# Patient Record
Sex: Female | Born: 1955
Health system: Southern US, Community
[De-identification: ages and names within clinical notes are randomized; demographics above are authoritative.]

## PROBLEM LIST (undated history)

## (undated) DIAGNOSIS — E785 Hyperlipidemia, unspecified: Secondary | ICD-10-CM

## (undated) DIAGNOSIS — E559 Vitamin D deficiency, unspecified: Secondary | ICD-10-CM

## (undated) DIAGNOSIS — E039 Hypothyroidism, unspecified: Secondary | ICD-10-CM

## (undated) HISTORY — PX: SPINE SURGERY: SHX786

## (undated) HISTORY — DX: Vitamin D deficiency, unspecified: E55.9

## (undated) HISTORY — DX: Hypothyroidism, unspecified: E03.9

## (undated) HISTORY — PX: ABDOMINAL HYSTERECTOMY: SHX81

## (undated) HISTORY — DX: Hyperlipidemia, unspecified: E78.5

---

## 2015-09-01 DIAGNOSIS — E039 Hypothyroidism, unspecified: Secondary | ICD-10-CM | POA: Diagnosis not present

## 2015-09-01 DIAGNOSIS — E7800 Pure hypercholesterolemia, unspecified: Secondary | ICD-10-CM | POA: Diagnosis not present

## 2015-09-01 DIAGNOSIS — H539 Unspecified visual disturbance: Secondary | ICD-10-CM | POA: Insufficient documentation

## 2015-09-01 DIAGNOSIS — Z6829 Body mass index (BMI) 29.0-29.9, adult: Secondary | ICD-10-CM | POA: Diagnosis not present

## 2015-09-21 DIAGNOSIS — H2513 Age-related nuclear cataract, bilateral: Secondary | ICD-10-CM | POA: Diagnosis not present

## 2015-09-21 DIAGNOSIS — D3131 Benign neoplasm of right choroid: Secondary | ICD-10-CM | POA: Diagnosis not present

## 2015-09-21 DIAGNOSIS — G43819 Other migraine, intractable, without status migrainosus: Secondary | ICD-10-CM | POA: Diagnosis not present

## 2016-04-10 DIAGNOSIS — Z0000 Encounter for general adult medical examination without abnormal findings: Secondary | ICD-10-CM | POA: Diagnosis not present

## 2016-04-10 DIAGNOSIS — Z1231 Encounter for screening mammogram for malignant neoplasm of breast: Secondary | ICD-10-CM | POA: Diagnosis not present

## 2016-04-10 DIAGNOSIS — E039 Hypothyroidism, unspecified: Secondary | ICD-10-CM | POA: Diagnosis not present

## 2016-04-10 DIAGNOSIS — E7800 Pure hypercholesterolemia, unspecified: Secondary | ICD-10-CM | POA: Diagnosis not present

## 2016-04-10 DIAGNOSIS — Z1211 Encounter for screening for malignant neoplasm of colon: Secondary | ICD-10-CM | POA: Diagnosis not present

## 2016-04-18 DIAGNOSIS — Z1231 Encounter for screening mammogram for malignant neoplasm of breast: Secondary | ICD-10-CM | POA: Diagnosis not present

## 2016-05-01 DIAGNOSIS — N951 Menopausal and female climacteric states: Secondary | ICD-10-CM | POA: Diagnosis not present

## 2016-05-01 DIAGNOSIS — Z01419 Encounter for gynecological examination (general) (routine) without abnormal findings: Secondary | ICD-10-CM | POA: Diagnosis not present

## 2016-05-01 DIAGNOSIS — Z0000 Encounter for general adult medical examination without abnormal findings: Secondary | ICD-10-CM | POA: Diagnosis not present

## 2016-05-01 DIAGNOSIS — Z6829 Body mass index (BMI) 29.0-29.9, adult: Secondary | ICD-10-CM | POA: Diagnosis not present

## 2016-05-24 DIAGNOSIS — Z1211 Encounter for screening for malignant neoplasm of colon: Secondary | ICD-10-CM | POA: Diagnosis not present

## 2016-05-24 DIAGNOSIS — K649 Unspecified hemorrhoids: Secondary | ICD-10-CM | POA: Diagnosis not present

## 2016-10-15 DIAGNOSIS — E038 Other specified hypothyroidism: Secondary | ICD-10-CM | POA: Diagnosis not present

## 2016-10-15 DIAGNOSIS — E7800 Pure hypercholesterolemia, unspecified: Secondary | ICD-10-CM | POA: Diagnosis not present

## 2016-10-16 DIAGNOSIS — Z6829 Body mass index (BMI) 29.0-29.9, adult: Secondary | ICD-10-CM | POA: Diagnosis not present

## 2016-10-16 DIAGNOSIS — E039 Hypothyroidism, unspecified: Secondary | ICD-10-CM | POA: Diagnosis not present

## 2016-10-16 DIAGNOSIS — E7800 Pure hypercholesterolemia, unspecified: Secondary | ICD-10-CM | POA: Diagnosis not present

## 2017-05-13 ENCOUNTER — Encounter: Payer: Self-pay | Admitting: Family Medicine

## 2017-05-13 ENCOUNTER — Ambulatory Visit (INDEPENDENT_AMBULATORY_CARE_PROVIDER_SITE_OTHER): Payer: BLUE CROSS/BLUE SHIELD | Admitting: Family Medicine

## 2017-05-13 VITALS — BP 110/80 | HR 63 | Ht 64.5 in | Wt 175.6 lb

## 2017-05-13 DIAGNOSIS — Z780 Asymptomatic menopausal state: Secondary | ICD-10-CM | POA: Diagnosis not present

## 2017-05-13 DIAGNOSIS — E7800 Pure hypercholesterolemia, unspecified: Secondary | ICD-10-CM | POA: Diagnosis not present

## 2017-05-13 DIAGNOSIS — Z1239 Encounter for other screening for malignant neoplasm of breast: Secondary | ICD-10-CM

## 2017-05-13 DIAGNOSIS — Z1231 Encounter for screening mammogram for malignant neoplasm of breast: Secondary | ICD-10-CM | POA: Diagnosis not present

## 2017-05-13 DIAGNOSIS — E559 Vitamin D deficiency, unspecified: Secondary | ICD-10-CM

## 2017-05-13 DIAGNOSIS — E039 Hypothyroidism, unspecified: Secondary | ICD-10-CM | POA: Diagnosis not present

## 2017-05-13 LAB — COMPREHENSIVE METABOLIC PANEL
ALBUMIN: 4.6 g/dL (ref 3.5–5.2)
ALK PHOS: 75 U/L (ref 39–117)
ALT: 18 U/L (ref 0–35)
AST: 19 U/L (ref 0–37)
BILIRUBIN TOTAL: 0.7 mg/dL (ref 0.2–1.2)
BUN: 14 mg/dL (ref 6–23)
CALCIUM: 10.2 mg/dL (ref 8.4–10.5)
CO2: 31 meq/L (ref 19–32)
CREATININE: 0.86 mg/dL (ref 0.40–1.20)
Chloride: 107 mEq/L (ref 96–112)
GFR: 70.98 mL/min (ref >60.00)
Glucose, Bld: 104 mg/dL — ABNORMAL HIGH (ref 70–99)
Potassium: 4.9 mEq/L (ref 3.5–5.1)
Sodium: 145 mEq/L (ref 135–145)
TOTAL PROTEIN: 7 g/dL (ref 6.0–8.3)

## 2017-05-13 LAB — URINALYSIS, ROUTINE W REFLEX MICROSCOPIC
BILIRUBIN URINE: NEGATIVE
Hgb urine dipstick: NEGATIVE
KETONES UR: NEGATIVE
LEUKOCYTES UA: NEGATIVE
Nitrite: NEGATIVE
PH: 6.5 (ref 5.0–8.0)
RBC / HPF: NONE SEEN (ref 0–?)
Total Protein, Urine: NEGATIVE
URINE GLUCOSE: NEGATIVE
UROBILINOGEN UA: 0.2 (ref 0.0–1.0)

## 2017-05-13 LAB — LIPID PANEL
CHOL/HDL RATIO: 2
Cholesterol: 158 mg/dL (ref 0–200)
HDL: 64.7 mg/dL (ref >39.00)
LDL Cholesterol: 69 mg/dL (ref 0–99)
NonHDL: 92.82
Triglycerides: 118 mg/dL (ref 0.0–149.0)
VLDL: 23.6 mg/dL (ref 0.0–40.0)

## 2017-05-13 LAB — CBC
HCT: 42.3 % (ref 36.0–46.0)
Hemoglobin: 14.3 g/dL (ref 12.0–15.0)
MCHC: 33.8 g/dL (ref 30.0–36.0)
MCV: 92.1 fl (ref 78.0–100.0)
PLATELETS: 224 10*3/uL (ref 150.0–400.0)
RBC: 4.59 Mil/uL (ref 3.87–5.11)
RDW: 13.1 % (ref 11.5–15.5)
WBC: 4.8 10*3/uL (ref 4.0–10.5)

## 2017-05-13 LAB — TSH: TSH: 1.36 u[IU]/mL (ref 0.35–4.50)

## 2017-05-13 MED ORDER — ATORVASTATIN CALCIUM 20 MG PO TABS: ORAL_TABLET | ORAL | 1 refills | Status: DC

## 2017-05-13 MED ORDER — ESTRADIOL 0.5 MG PO TABS: 0.5000 mg | ORAL_TABLET | Freq: Two times a day (BID) | ORAL | 1 refills | Status: DC

## 2017-05-13 NOTE — Patient Instructions (Addendum)
Health Maintenance, Female Adopting a healthy lifestyle and getting preventive care can go a long way to promote health and wellness. Talk with your health care provider about what schedule of regular examinations is right for you. This is a good chance for you to check in with your provider about disease prevention and staying healthy. In between checkups, there are plenty of things you can do on your own. Experts have done a lot of research about which lifestyle changes and preventive measures are most likely to keep you healthy. Ask your health care provider for more information. Weight and diet Eat a healthy diet  Be sure to include plenty of vegetables, fruits, low-fat dairy products, and lean protein.  Do not eat a lot of foods high in solid fats, added sugars, or salt.  Get regular exercise. This is one of the most important things you can do for your health. ? Most adults should exercise for at least 150 minutes each week. The exercise should increase your heart rate and make you sweat (moderate-intensity exercise). ? Most adults should also do strengthening exercises at least twice a week. This is in addition to the moderate-intensity exercise.  Maintain a healthy weight  Body mass index (BMI) is a measurement that can be used to identify possible weight problems. It estimates body fat based on height and weight. Your health care provider can help determine your BMI and help you achieve or maintain a healthy weight.  For females 20 years of age and older: ? A BMI below 18.5 is considered underweight. ? A BMI of 18.5 to 24.9 is normal. ? A BMI of 25 to 29.9 is considered overweight. ? A BMI of 30 and above is considered obese.  Watch levels of cholesterol and blood lipids  You should start having your blood tested for lipids and cholesterol at 63 years of age, then have this test every 5 years.  You may need to have your cholesterol levels checked more often if: ? Your lipid or  cholesterol levels are high. ? You are older than 63 years of age. ? You are at high risk for heart disease.  Cancer screening Lung Cancer  Lung cancer screening is recommended for adults 55-80 years old who are at high risk for lung cancer because of a history of smoking.  A yearly low-dose CT scan of the lungs is recommended for people who: ? Currently smoke. ? Have quit within the past 15 years. ? Have at least a 30-pack-year history of smoking. A pack year is smoking an average of one pack of cigarettes a day for 1 year.  Yearly screening should continue until it has been 15 years since you quit.  Yearly screening should stop if you develop a health problem that would prevent you from having lung cancer treatment.  Breast Cancer  Practice breast self-awareness. This means understanding how your breasts normally appear and feel.  It also means doing regular breast self-exams. Let your health care provider know about any changes, no matter how small.  If you are in your 20s or 30s, you should have a clinical breast exam (CBE) by a health care provider every 1-3 years as part of a regular health exam.  If you are 40 or older, have a CBE every year. Also consider having a breast X-ray (mammogram) every year.  If you have a family history of breast cancer, talk to your health care provider about genetic screening.  If you are at high risk   for breast cancer, talk to your health care provider about having an MRI and a mammogram every year.  Breast cancer gene (BRCA) assessment is recommended for women who have family members with BRCA-related cancers. BRCA-related cancers include: ? Breast. ? Ovarian. ? Tubal. ? Peritoneal cancers.  Results of the assessment will determine the need for genetic counseling and BRCA1 and BRCA2 testing.  Cervical Cancer Your health care provider may recommend that you be screened regularly for cancer of the pelvic organs (ovaries, uterus, and  vagina). This screening involves a pelvic examination, including checking for microscopic changes to the surface of your cervix (Pap test). You may be encouraged to have this screening done every 3 years, beginning at age 22.  For women ages 56-65, health care providers may recommend pelvic exams and Pap testing every 3 years, or they may recommend the Pap and pelvic exam, combined with testing for human papilloma virus (HPV), every 5 years. Some types of HPV increase your risk of cervical cancer. Testing for HPV may also be done on women of any age with unclear Pap test results.  Other health care providers may not recommend any screening for nonpregnant women who are considered low risk for pelvic cancer and who do not have symptoms. Ask your health care provider if a screening pelvic exam is right for you.  If you have had past treatment for cervical cancer or a condition that could lead to cancer, you need Pap tests and screening for cancer for at least 20 years after your treatment. If Pap tests have been discontinued, your risk factors (such as having a new sexual partner) need to be reassessed to determine if screening should resume. Some women have medical problems that increase the chance of getting cervical cancer. In these cases, your health care provider may recommend more frequent screening and Pap tests.  Colorectal Cancer  This type of cancer can be detected and often prevented.  Routine colorectal cancer screening usually begins at 63 years of age and continues through 63 years of age.  Your health care provider may recommend screening at an earlier age if you have risk factors for colon cancer.  Your health care provider may also recommend using home test kits to check for hidden blood in the stool.  A small camera at the end of a tube can be used to examine your colon directly (sigmoidoscopy or colonoscopy). This is done to check for the earliest forms of colorectal  cancer.  Routine screening usually begins at age 33.  Direct examination of the colon should be repeated every 5-10 years through 63 years of age. However, you may need to be screened more often if early forms of precancerous polyps or small growths are found.  Skin Cancer  Check your skin from head to toe regularly.  Tell your health care provider about any new moles or changes in moles, especially if there is a change in a mole's shape or color.  Also tell your health care provider if you have a mole that is larger than the size of a pencil eraser.  Always use sunscreen. Apply sunscreen liberally and repeatedly throughout the day.  Protect yourself by wearing long sleeves, pants, a wide-brimmed hat, and sunglasses whenever you are outside.  Heart disease, diabetes, and high blood pressure  High blood pressure causes heart disease and increases the risk of stroke. High blood pressure is more likely to develop in: ? People who have blood pressure in the high end of  the normal range (130-139/85-89 mm Hg). ? People who are overweight or obese. ? People who are African American.  If you are 21-29 years of age, have your blood pressure checked every 3-5 years. If you are 3 years of age or older, have your blood pressure checked every year. You should have your blood pressure measured twice-once when you are at a hospital or clinic, and once when you are not at a hospital or clinic. Record the average of the two measurements. To check your blood pressure when you are not at a hospital or clinic, you can use: ? An automated blood pressure machine at a pharmacy. ? A home blood pressure monitor.  If you are between 17 years and 37 years old, ask your health care provider if you should take aspirin to prevent strokes.  Have regular diabetes screenings. This involves taking a blood sample to check your fasting blood sugar level. ? If you are at a normal weight and have a low risk for diabetes,  have this test once every three years after 63 years of age. ? If you are overweight and have a high risk for diabetes, consider being tested at a younger age or more often. Preventing infection Hepatitis B  If you have a higher risk for hepatitis B, you should be screened for this virus. You are considered at high risk for hepatitis B if: ? You were born in a country where hepatitis B is common. Ask your health care provider which countries are considered high risk. ? Your parents were born in a high-risk country, and you have not been immunized against hepatitis B (hepatitis B vaccine). ? You have HIV or AIDS. ? You use needles to inject street drugs. ? You live with someone who has hepatitis B. ? You have had sex with someone who has hepatitis B. ? You get hemodialysis treatment. ? You take certain medicines for conditions, including cancer, organ transplantation, and autoimmune conditions.  Hepatitis C  Blood testing is recommended for: ? Everyone born from [birth year] through 1966. ? Anyone with known risk factors for hepatitis C.  Sexually transmitted infections (STIs)  You should be screened for sexually transmitted infections (STIs) including gonorrhea and chlamydia if: ? You are sexually active and are younger than 63 years of age. ? You are older than 63 years of age and your health care provider tells you that you are at risk for this type of infection. ? Your sexual activity has changed since you were last screened and you are at an increased risk for chlamydia or gonorrhea. Ask your health care provider if you are at risk.  If you do not have HIV, but are at risk, it may be recommended that you take a prescription medicine daily to prevent HIV infection. This is called pre-exposure prophylaxis (PrEP). You are considered at risk if: ? You are sexually active and do not regularly use condoms or know the HIV status of your partner(s). ? You take drugs by injection. ? You are  sexually active with a partner who has HIV.  Talk with your health care provider about whether you are at high risk of being infected with HIV. If you choose to begin PrEP, you should first be tested for HIV. You should then be tested every 3 months for as long as you are taking PrEP. Pregnancy  If you are premenopausal and you may become pregnant, ask your health care provider about preconception counseling.  If you may become  pregnant, take 400 to 800 micrograms (mcg) of folic acid every day.  If you want to prevent pregnancy, talk to your health care provider about birth control (contraception). Osteoporosis and menopause  Osteoporosis is a disease in which the bones lose minerals and strength with aging. This can result in serious bone fractures. Your risk for osteoporosis can be identified using a bone density scan.  If you are 10 years of age or older, or if you are at risk for osteoporosis and fractures, ask your health care provider if you should be screened.  Ask your health care provider whether you should take a calcium or vitamin D supplement to lower your risk for osteoporosis.  Menopause may have certain physical symptoms and risks.  Hormone replacement therapy may reduce some of these symptoms and risks. Talk to your health care provider about whether hormone replacement therapy is right for you. Follow these instructions at home:  Schedule regular health, dental, and eye exams.  Stay current with your immunizations.  Do not use any tobacco products including cigarettes, chewing tobacco, or electronic cigarettes.  If you are pregnant, do not drink alcohol.  If you are breastfeeding, limit how much and how often you drink alcohol.  Limit alcohol intake to no more than 1 drink per day for nonpregnant women. One drink equals 12 ounces of beer, 5 ounces of wine, or 1 ounces of hard liquor.  Do not use street drugs.  Do not share needles.  Ask your health care  provider for help if you need support or information about quitting drugs.  Tell your health care provider if you often feel depressed.  Tell your health care provider if you have ever been abused or do not feel safe at home. This information is not intended to replace advice given to you by your health care provider. Make sure you discuss any questions you have with your health care provider. Document Released: 09/03/2010 Document Revised: 07/27/2015 Document Reviewed: 11/22/2014 Elsevier Interactive Patient Education  2018 [employer]. Menopause and Herbal Products What is menopause? Menopause is the normal time of life when menstrual periods decrease in frequency and eventually stop completely. This process can take several years for some women. Menopause is complete when you have had an absence of menstruation for a full year since your last menstrual period. It usually occurs between the ages of 2 and 31. It is not common for menopause to begin before the age of 47. During menopause, your body stops producing the female hormones estrogen and progesterone. Common symptoms associated with this loss of hormones (vasomotor symptoms) are:  Hot flashes.  Hot flushes.  Night sweats.  Other common symptoms and complications of menopause include:  Decrease in sex drive.  Vaginal dryness and thinning of the walls of the vagina. This can make sex painful.  Dryness of the skin and development of wrinkles.  Headaches.  Tiredness.  Irritability.  Memory problems.  Weight gain.  Bladder infections.  Hair growth on the face and chest.  Inability to reproduce offspring (infertility).  Loss of density in the bones (osteoporosis) increasing your risk for breaks (fractures).  Depression.  Hardening and narrowing of the arteries (atherosclerosis). This increases your risk of heart attack and stroke.  What treatment options are available? There are many treatment choices for  menopause symptoms. The most common treatment is hormone replacement therapy. Many alternative therapies for menopause are emerging, including the use of herbal products. These supplements can be found in the form  of herbs, teas, oils, tinctures, and pills. Common herbal supplements for menopause are made from plants that contain phytoestrogens. Phytoestrogens are compounds that occur naturally in plants and plant products. They act like estrogen in the body. Foods and herbs that contain phytoestrogens include:  Soy.  Flax seeds.  Red clover.  Ginseng.  What menopause symptoms may be helped if I use herbal products?  Vasomotor symptoms. These may be helped by: ? Soy. Some studies show that soy may have a moderate benefit for hot flashes. ? Black cohosh. There is limited evidence indicating this may be beneficial for hot flashes.  Symptoms that are related to heart and blood vessel disease. These may be helped by soy. Studies have shown that soy can help to lower cholesterol.  Depression. This may be helped by: ? St. John's wort. There is limited evidence that shows this may help mild to moderate depression. ? Black cohosh. There is evidence that this may help depression and mood swings.  Osteoporosis. Soy may help to decrease bone loss that is associated with menopause and may prevent osteoporosis. Limited evidence indicates that red clover may offer some bone loss protection as well. Other herbal products that are commonly used during menopause lack enough evidence to support their use as a replacement for conventional menopause therapies. These products include evening primrose, ginseng, and red clover. What are the cases when herbal products should not be used during menopause? Do not use herbal products during menopause without your health care provider's approval if:  You are taking medicine.  You have a preexisting liver condition.  Are there any risks in my taking herbal  products during menopause? If you choose to use herbal products to help with symptoms of menopause, keep in mind that:  Different supplements have different and unmeasured amounts of herbal ingredients.  Herbal products are not regulated the same way that medicines are.  Concentrations of herbs may vary depending on the way they are prepared. For example, the concentration may be different in a pill, tea, oil, and tincture.  Little is known about the risks of using herbal products, particularly the risks of long-term use.  Some herbal supplements can be harmful when combined with certain medicines.  Most commonly reported side effects of herbal products are mild. However, if used improperly, many herbal supplements can cause serious problems. Talk to your health care provider before starting any herbal product. If problems develop, stop taking the supplement and let your health care provider know. This information is not intended to replace advice given to you by your health care provider. Make sure you discuss any questions you have with your health care provider. Document Released: 08/07/2007 Document Revised: 01/16/2016 Document Reviewed: 08/03/2013 Elsevier Interactive Patient Education  2017 [place] Need to Know About Cancer Prevention Although there is no guaranteed method for preventing all cancers, there are many steps that you can take to lower your risk of developing the disease. Making healthy food choices, maintaining a healthy lifestyle, getting regular screenings, and knowing your family's cancer history are all ways that can help to reduce your risk. What nutrition changes can be made? Maintaining a healthy, plant-based diet is one of the easiest ways to lower your cancer risk.  Try to eat more than 2 cups of fruits and vegetables every day.  Eat whole-grain foods instead of refined or processed grains.  Cut down on the amount of red meat and processed meat  that you eat. Also limit  your intake of charred and smoked meat.  Eat portions that help you stay at a healthy weight.  Limit alcohol intake to no more than 1 drink a day for nonpregnant women and 2 drinks a day for men. One drink equals 12 oz of beer, 5 oz of wine, or 1 oz of hard liquor.  What lifestyle changes can be made?  Do not use any products that contain nicotine or tobacco, such as cigarettes and e-cigarettes. If you need help quitting, ask your health care provider.  Get regular exercise. Adults should aim for 150 minutes of moderate-intensity exercise (walking, biking, yoga) or 75 minutes of vigorous exercise (running, circuit training, swimming) every week. Exercise should be spread throughout the week, if possible.  Stay at a healthy weight. Talk with your health care provider about what your weight should be.  Reduce activities that involve a lack of physical activity (are sedentary) such as watching TV.  Stay safe in the sun by applying sunscreen and covering up with hats, clothing, and sunglasses.  Do not use tanning beds or sunlamps.  Avoid exposure to harmful substances such as asbestos, silica, solvents, or radon. Wear a protective mask if you must work near harmful substances. Have your home checked for radon, and hire a professional to lower the radon level if needed.  Get vaccines to help prevent conditions that can eventually lead to cancer, such as hepatitis and HPV (human papillomavirus). Ask your health care provider about which vaccines you should get. Why are these changes important?  Tobacco use is the leading cause of cancer and death from cancer.  Cancer cases in the [place] are linked to higher body weight and obesity, lack of physical activity, and an unhealthy diet.  Cutting your exposure to ultraviolet (UV) radiation and avoiding sunburns lowers your chance of developing skin cancer or melanoma.  HPV is associated with several types of cancer,  such as penile, anal, cervical, vulvar, and throat cancer. The HPV vaccine can help to reduce the spread of this virus and help to lower the risk of developing cancer. What can happen if changes are not made? If you do not maintain a healthy diet and lifestyle, reduce sun exposure, or quit tobacco use, you may raise your risk of developing certain types of cancer.  Tobacco use has been linked to cancers of the lung, mouth, esophagus, throat, bladder, kidney, liver, stomach, colon and rectum, and cervix.  Obesity has been linked to an increased risk of developing cancers of the breast, colon and rectum, esophagus, kidney, pancreas, and gallbladder.  Exposure to UV radiation can cause skin damage that can lead to skin cancer and melanoma.  What can I do to lower my risk? Along with having a healthy diet and lifestyle, you should talk with your health care provider about recommendations for cancer screening.  Pap and HPV testing help to reduce a woman's risk for developing cervical cancer.  Mammograms help to detect signs of breast cancer and have been shown to reduce death from breast cancer in women over age 35.  Colorectal cancer screenings-including colonoscopy, sigmoidoscopy, and stool tests-can help to detect early signs of cancer.  Lung cancer screening with CT scanning has been shown to reduce cancer deaths in heavy smokers over age 5.  Skin exams are sometimes recommended for people who are at a high risk for developing skin cancer. Talk with your health care provider or dermatologist if you notice any new skin changes, moles, or changes to  existing moles.  Also talk with your health care provider about any history of cancer in your family. Depending on your family history of cancer, your health care provider may recommend genetic testing to determine whether you may be at higher risk for developing certain types of cancer. Results from these tests can help in making decisions about  future medical care and steps for prevention. Where to find more information:  [place]: www.cancer.gov  Cancer Trends Progress Report: www.progressreport.cancer.gov  American Cancer Society: www.cancer.org Contact a health care provider if:  You would like to discuss healthy ways to improve your diet and lifestyle.  You would like to learn more about quitting smoking or tobacco use.  You would like to discuss your family history of cancer and recommendations for cancer screening. Summary  You can take steps to reduce your risk of developing cancer.  Maintaining a healthy, plant-based diet is one of the easiest ways to lower your cancer risk.  Lifestyle changes can help to reduce your cancer risk. These include staying away from tobacco, reducing sun exposure, and getting regular exercise.  Follow recommendations from your health care provider about screening tests for cancer of the cervix, breast, colon and rectum, lung, and skin.  Talk with your health care provider about any history of cancer in your family. This information is not intended to replace advice given to you by your health care provider. Make sure you discuss any questions you have with your health care provider. Document Released: 12/01/2015 Document Revised: 12/01/2015 Document Reviewed: 12/01/2015 Elsevier Interactive Patient Education  2018 [place] protect organs, store calcium, and anchor muscles. Good health habits, such as eating nutritious foods and exercising regularly, are important for maintaining healthy bones. They can also help to prevent a condition that causes bones to lose density and become weak and brittle (osteoporosis). Why is bone mass important? Bone mass refers to the amount of bone tissue that you have. The higher your bone mass, the stronger your bones. An important step toward having healthy bones throughout life is to have strong and dense bones  during childhood. A young adult who has a high bone mass is more likely to have a high bone mass later in life. Bone mass at its greatest it is called peak bone mass. A large decline in bone mass occurs in older adults. In women, it occurs about the time of menopause. During this time, it is important to practice good health habits, because if more bone is lost than what is replaced, the bones will become less healthy and more likely to break (fracture). If you find that you have a low bone mass, you may be able to prevent osteoporosis or further bone loss by changing your diet and lifestyle. How can I find out if my bone mass is low? Bone mass can be measured with an X-ray test that is called a bone mineral density (BMD) test. This test is recommended for all women who are age 58 or older. It may also be recommended for men who are age 25 or older, or for people who are more likely to develop osteoporosis due to:  Having bones that break easily.  Having a long-term disease that weakens bones, such as kidney disease or rheumatoid arthritis.  Having menopause earlier than normal.  Taking medicine that weakens bones, such as steroids, thyroid hormones, or hormone treatment for breast cancer or prostate cancer.  Smoking.  Drinking three or more alcoholic drinks  each day.  What are the nutritional recommendations for healthy bones? To have healthy bones, you need to get enough of the right minerals and vitamins. Most nutrition experts recommend getting these nutrients from the foods that you eat. Nutritional recommendations vary from person to person. Ask your health care provider what is healthy for you. Here are some general guidelines. Calcium Recommendations Calcium is the most important (essential) mineral for bone health. Most people can get enough calcium from their diet, but supplements may be recommended for people who are at risk for osteoporosis. Good sources of calcium include:  Dairy  products, such as low-fat or nonfat milk, cheese, and yogurt.  Dark green leafy vegetables, such as bok choy and broccoli.  Calcium-fortified foods, such as orange juice, cereal, bread, soy beverages, and tofu products.  Nuts, such as almonds.  Follow these recommended amounts for daily calcium intake:  Children, age 9?3: 700 mg.  Children, age 13?8: 1,000 mg.  Children, age 35?13: 1,300 mg.  Teens, age 139?18: 1,300 mg.  Adults, age 58?50: 1,000 mg.  Adults, age 68?70: ? Men: 1,000 mg. ? Women: 1,200 mg.  Adults, age 96 or older: 1,200 mg.  Pregnant and breastfeeding females: ? Teens: 1,300 mg. ? Adults: 1,000 mg.  Vitamin D Recommendations Vitamin D is the most essential vitamin for bone health. It helps the body to absorb calcium. Sunlight stimulates the skin to make vitamin D, so be sure to get enough sunlight. If you live in a cold climate or you do not get outside often, your health care provider may recommend that you take vitamin D supplements. Good sources of vitamin D in your diet include:  Egg yolks.  Saltwater fish.  Milk and cereal fortified with vitamin D.  Follow these recommended amounts for daily vitamin D intake:  Children and teens, age 37?18: 54 international units.  Adults, age 90 or younger: 400-800 international units.  Adults, age 28 or older: 800-1,000 international units.  Other Nutrients Other nutrients for bone health include:  Phosphorus. This mineral is found in meat, poultry, dairy foods, nuts, and legumes. The recommended daily intake for adult men and adult women is 700 mg.  Magnesium. This mineral is found in seeds, nuts, dark green vegetables, and legumes. The recommended daily intake for adult men is 400?420 mg. For adult women, it is 310?320 mg.  Vitamin K. This vitamin is found in green leafy vegetables. The recommended daily intake is 120 mg for adult men and 90 mg for adult women.  What type of physical activity is best for  building and maintaining healthy bones? Weight-bearing and strength-building activities are important for building and maintaining peak bone mass. Weight-bearing activities cause muscles and bones to work against gravity. Strength-building activities increases muscle strength that supports bones. Weight-bearing and muscle-building activities include:  Walking and hiking.  Jogging and running.  Dancing.  Gym exercises.  Lifting weights.  Tennis and racquetball.  Climbing stairs.  Aerobics.  Adults should get at least 30 minutes of moderate physical activity on most days. Children should get at least 60 minutes of moderate physical activity on most days. Ask your health care provide what type of exercise is best for you. Where can I find more information? For more information, check out the following websites:  [place]: YardHomes.se  [employer] of Health: http://www.niams.AnonymousEar.fr.asp  This information is not intended to replace advice given to you by your health care provider. Make sure you discuss any questions you have with  your health care provider. Document Released: 05/11/2003 Document Revised: 09/08/2015 Document Reviewed: 02/23/2014 Elsevier Interactive Patient Education  2018 [place] Maintenance for Postmenopausal Women Menopause is a normal process in which your reproductive ability comes to an end. This process happens gradually over a span of months to years, usually between the ages of 66 and 71. Menopause is complete when you have missed 12 consecutive menstrual periods. It is important to talk with your health care provider about some of the most common conditions that affect postmenopausal women, such as heart disease, cancer, and bone loss (osteoporosis). Adopting a healthy lifestyle and getting preventive care can help to promote your health and wellness.  Those actions can also lower your chances of developing some of these common conditions. What should I know about menopause? During menopause, you may experience a number of symptoms, such as:  Moderate-to-severe hot flashes.  Night sweats.  Decrease in sex drive.  Mood swings.  Headaches.  Tiredness.  Irritability.  Memory problems.  Insomnia.  Choosing to treat or not to treat menopausal changes is an individual decision that you make with your health care provider. What should I know about hormone replacement therapy and supplements? Hormone therapy products are effective for treating symptoms that are associated with menopause, such as hot flashes and night sweats. Hormone replacement carries certain risks, especially as you become older. If you are thinking about using estrogen or estrogen with progestin treatments, discuss the benefits and risks with your health care provider. What should I know about heart disease and stroke? Heart disease, heart attack, and stroke become more likely as you age. This may be due, in part, to the hormonal changes that your body experiences during menopause. These can affect how your body processes dietary fats, triglycerides, and cholesterol. Heart attack and stroke are both medical emergencies. There are many things that you can do to help prevent heart disease and stroke:  Have your blood pressure checked at least every 1-2 years. High blood pressure causes heart disease and increases the risk of stroke.  If you are 1-50 years old, ask your health care provider if you should take aspirin to prevent a heart attack or a stroke.  Do not use any tobacco products, including cigarettes, chewing tobacco, or electronic cigarettes. If you need help quitting, ask your health care provider.  It is important to eat a healthy diet and maintain a healthy weight. ? Be sure to include plenty of vegetables, fruits, low-fat dairy products, and lean  protein. ? Avoid eating foods that are high in solid fats, added sugars, or salt (sodium).  Get regular exercise. This is one of the most important things that you can do for your health. ? Try to exercise for at least 150 minutes each week. The type of exercise that you do should increase your heart rate and make you sweat. This is known as moderate-intensity exercise. ? Try to do strengthening exercises at least twice each week. Do these in addition to the moderate-intensity exercise.  Know your numbers.Ask your health care provider to check your cholesterol and your blood glucose. Continue to have your blood tested as directed by your health care provider.  What should I know about cancer screening? There are several types of cancer. Take the following steps to reduce your risk and to catch any cancer development as early as possible. Breast Cancer  Practice breast self-awareness. ? This means understanding how your breasts normally appear and feel. ? It also  means doing regular breast self-exams. Let your health care provider know about any changes, no matter how small.  If you are 65 or older, have a clinician do a breast exam (clinical breast exam or CBE) every year. Depending on your age, family history, and medical history, it may be recommended that you also have a yearly breast X-ray (mammogram).  If you have a family history of breast cancer, talk with your health care provider about genetic screening.  If you are at high risk for breast cancer, talk with your health care provider about having an MRI and a mammogram every year.  Breast cancer (BRCA) gene test is recommended for women who have family members with BRCA-related cancers. Results of the assessment will determine the need for genetic counseling and BRCA1 and for BRCA2 testing. BRCA-related cancers include these types: ? Breast. This occurs in males or females. ? Ovarian. ? Tubal. This may also be called fallopian tube  cancer. ? Cancer of the abdominal or pelvic lining (peritoneal cancer). ? Prostate. ? Pancreatic.  Cervical, Uterine, and Ovarian Cancer Your health care provider may recommend that you be screened regularly for cancer of the pelvic organs. These include your ovaries, uterus, and vagina. This screening involves a pelvic exam, which includes checking for microscopic changes to the surface of your cervix (Pap test).  For women ages 21-65, health care providers may recommend a pelvic exam and a Pap test every three years. For women ages 82-65, they may recommend the Pap test and pelvic exam, combined with testing for human papilloma virus (HPV), every five years. Some types of HPV increase your risk of cervical cancer. Testing for HPV may also be done on women of any age who have unclear Pap test results.  Other health care providers may not recommend any screening for nonpregnant women who are considered low risk for pelvic cancer and have no symptoms. Ask your health care provider if a screening pelvic exam is right for you.  If you have had past treatment for cervical cancer or a condition that could lead to cancer, you need Pap tests and screening for cancer for at least 20 years after your treatment. If Pap tests have been discontinued for you, your risk factors (such as having a new sexual partner) need to be reassessed to determine if you should start having screenings again. Some women have medical problems that increase the chance of getting cervical cancer. In these cases, your health care provider may recommend that you have screening and Pap tests more often.  If you have a family history of uterine cancer or ovarian cancer, talk with your health care provider about genetic screening.  If you have vaginal bleeding after reaching menopause, tell your health care provider.  There are currently no reliable tests available to screen for ovarian cancer.  Lung Cancer Lung cancer screening is  recommended for adults 72-88 years old who are at high risk for lung cancer because of a history of smoking. A yearly low-dose CT scan of the lungs is recommended if you:  Currently smoke.  Have a history of at least 30 pack-years of smoking and you currently smoke or have quit within the past 15 years. A pack-year is smoking an average of one pack of cigarettes per day for one year.  Yearly screening should:  Continue until it has been 15 years since you quit.  Stop if you develop a health problem that would prevent you from having lung cancer treatment.  Colorectal Cancer  This type of cancer can be detected and can often be prevented.  Routine colorectal cancer screening usually begins at age 63 and continues through age 45.  If you have risk factors for colon cancer, your health care provider may recommend that you be screened at an earlier age.  If you have a family history of colorectal cancer, talk with your health care provider about genetic screening.  Your health care provider may also recommend using home test kits to check for hidden blood in your stool.  A small camera at the end of a tube can be used to examine your colon directly (sigmoidoscopy or colonoscopy). This is done to check for the earliest forms of colorectal cancer.  Direct examination of the colon should be repeated every 5-10 years until age 4. However, if early forms of precancerous polyps or small growths are found or if you have a family history or genetic risk for colorectal cancer, you may need to be screened more often.  Skin Cancer  Check your skin from head to toe regularly.  Monitor any moles. Be sure to tell your health care provider: ? About any new moles or changes in moles, especially if there is a change in a mole's shape or color. ? If you have a mole that is larger than the size of a pencil eraser.  If any of your family members has a history of skin cancer, especially at a young age,  talk with your health care provider about genetic screening.  Always use sunscreen. Apply sunscreen liberally and repeatedly throughout the day.  Whenever you are outside, protect yourself by wearing long sleeves, pants, a wide-brimmed hat, and sunglasses.  What should I know about osteoporosis? Osteoporosis is a condition in which bone destruction happens more quickly than new bone creation. After menopause, you may be at an increased risk for osteoporosis. To help prevent osteoporosis or the bone fractures that can happen because of osteoporosis, the following is recommended:  If you are 46-52 years old, get at least 1,000 mg of calcium and at least 600 mg of vitamin D per day.  If you are older than age 58 but younger than age 63, get at least 1,200 mg of calcium and at least 600 mg of vitamin D per day.  If you are older than age 74, get at least 1,200 mg of calcium and at least 800 mg of vitamin D per day.  Smoking and excessive alcohol intake increase the risk of osteoporosis. Eat foods that are rich in calcium and vitamin D, and do weight-bearing exercises several times each week as directed by your health care provider. What should I know about how menopause affects my mental health? Depression may occur at any age, but it is more common as you become older. Common symptoms of depression include:  Low or sad mood.  Changes in sleep patterns.  Changes in appetite or eating patterns.  Feeling an overall lack of motivation or enjoyment of activities that you previously enjoyed.  Frequent crying spells.  Talk with your health care provider if you think that you are experiencing depression. What should I know about immunizations? It is important that you get and maintain your immunizations. These include:  Tetanus, diphtheria, and pertussis (Tdap) booster vaccine.  Influenza every year before the flu season begins.  Pneumonia vaccine.  Shingles vaccine.  Your health care  provider may also recommend other immunizations. This information is not intended to replace advice given  to you by your health care provider. Make sure you discuss any questions you have with your health care provider. Document Released: 04/12/2005 Document Revised: 09/08/2015 Document Reviewed: 11/22/2014 Elsevier Interactive Patient Education  2018 [employer].

## 2017-05-13 NOTE — Progress Notes (Addendum)
Subjective:  Patient ID: Brianna Luna, female    DOB: 1956/01/30  Age: 62 y.o. MRN: [id number]  CC: New Patient (Initial Visit)   HPI [name] presents for follow-up of her thyroid and, elevated cholesterol, vitamin D deficiency and her post menopausal use of of estradiol.  Hypothyroidism and cholesterol been well controlled with the Synthroid and Lipitor that she continues to take.  She has been on estradiol for the last 10 years or so status post surgical menopause with a total abdominal hysterectomy or endometriosis and dysfunctional uterine bleeding secondary to uterine fibroids.  Last mammogram 2017.  She does not smoke or use illicit drugs.  She drinks alcohol occasionally.  She lives with her husband here in [place].  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   They have 2 grown daughters.  One is about to move to [place].  Outpatient Medications Prior to Visit  Medication Sig Dispense Refill  . atorvastatin (LIPITOR) 20 MG tablet     . estradiol (ESTRACE) 0.5 MG tablet     . levothyroxine (SYNTHROID, LEVOTHROID) 75 MCG tablet      No facility-administered medications prior to visit.     ROS Review of Systems  Constitutional: Negative.   HENT: Negative.   Eyes: Negative.   Respiratory: Negative.   Cardiovascular: Negative.   Gastrointestinal: Negative.   Endocrine: Negative for cold intolerance and heat intolerance.  Genitourinary: Negative.   Musculoskeletal: Negative for gait problem and myalgias.  Skin: Negative for rash.  Allergic/Immunologic: Negative for immunocompromised state.  Neurological: Negative for weakness and headaches.  Hematological: Does not bruise/bleed easily.  Psychiatric/Behavioral: Negative.     Objective:  BP 110/80 (BP Location: Left Arm, Patient Position: Sitting, Cuff Size: Normal)   Pulse 63   Ht 5' 4.5" (1.638 m)   Wt 175 lb 9.6 oz (79.7 kg)   BMI 29.68 kg/m   BP Readings from Last 3 Encounters:  05/13/17  110/80    Wt Readings from Last 3 Encounters:  05/13/17 175 lb 9.6 oz (79.7 kg)    Physical Exam  Constitutional: She is oriented to person, place, and time. She appears well-developed and well-nourished. No distress.  HENT:  Head: Normocephalic and atraumatic.  Right Ear: External ear normal.  Left Ear: External ear normal.  Nose: Nose normal.  Mouth/Throat: Oropharynx is clear and moist. No oropharyngeal exudate.  Eyes: Conjunctivae and EOM are normal. Pupils are equal, round, and reactive to light. Right eye exhibits no discharge. Left eye exhibits no discharge. No scleral icterus.  Neck: Normal range of motion. Neck supple. No JVD present. No tracheal deviation present. No thyromegaly present.  Cardiovascular: Normal rate, regular rhythm, normal heart sounds and intact distal pulses. Exam reveals no gallop and no friction rub.  No murmur heard. Pulmonary/Chest: Effort normal and breath sounds normal. No stridor. No respiratory distress. She has no wheezes. She has no rales. She exhibits no tenderness.  Abdominal: Bowel sounds are normal. She exhibits no distension and no mass. There is no tenderness. There is no rebound and no guarding.  Genitourinary: Rectum normal, prostate normal and penis normal. Rectal exam shows guaiac negative stool. No penile tenderness.  Musculoskeletal: Normal range of motion. She exhibits no edema, tenderness or deformity.  Lymphadenopathy:    She has no cervical adenopathy.  Neurological: She is alert and oriented to person, place, and time. She has normal reflexes.  Skin: Skin is warm and dry. She is not diaphoretic. No erythema. No pallor.  Psychiatric: She has a normal mood and affect. Her behavior is normal. Judgment and thought content normal.  Nursing note and vitals reviewed.   Lab Results  Component Value Date   WBC 4.8 05/13/2017   HGB 14.3 05/13/2017   HCT 42.3 05/13/2017   PLT 224.0 05/13/2017   GLUCOSE 104 (H) 05/13/2017   CHOL 158  05/13/2017   TRIG 118.0 05/13/2017   HDL 64.70 05/13/2017   LDLCALC 69 05/13/2017   ALT 18 05/13/2017   AST 19 05/13/2017   NA 145 05/13/2017   K 4.9 05/13/2017   CL 107 05/13/2017   CREATININE 0.86 05/13/2017   BUN 14 05/13/2017   CO2 31 05/13/2017   TSH 1.36 05/13/2017    Patient was never admitted.  Assessment & Plan:   Brianna Luna was seen today for new patient (initial visit).  Diagnoses and all orders for this visit:  Elevated LDL cholesterol level -     CBC -     Comprehensive metabolic panel -     Lipid panel -     atorvastatin (LIPITOR) 20 MG tablet; Take one daily  Acquired hypothyroidism -     CBC -     TSH -     levothyroxine (SYNTHROID, LEVOTHROID) 75 MCG tablet; One every morning on a fasting stomach 1 hour prior to eating.  Post-menopausal -     Urinalysis, Routine w reflex microscopic -     DG Bone Density; Future -     estradiol (ESTRACE) 0.5 MG tablet; Take 1 tablet (0.5 mg total) by mouth 2 (two) times daily.  Vitamin D deficiency -     VITAMIN D 25 Hydroxy (Vit-D Deficiency, Fractures) -     DG Bone Density; Future -     Vitamin D, Ergocalciferol, (DRISDOL) 50000 units CAPS capsule; Take 1 capsule (50,000 Units total) by mouth every 7 (seven) days for 30 doses.  Screening for breast cancer -     MM Digital Screening; Future   I have changed Tamula Nearhood's atorvastatin, estradiol, and levothyroxine. I am also having her start on Vitamin D (Ergocalciferol).  Meds ordered this encounter  Medications  . atorvastatin (LIPITOR) 20 MG tablet    Sig: Take one daily    Dispense:  100 tablet    Refill:  1  . estradiol (ESTRACE) 0.5 MG tablet    Sig: Take 1 tablet (0.5 mg total) by mouth 2 (two) times daily.    Dispense:  180 tablet    Refill:  1  . Vitamin D, Ergocalciferol, (DRISDOL) 50000 units CAPS capsule    Sig: Take 1 capsule (50,000 Units total) by mouth every 7 (seven) days for 30 doses.    Dispense:  30 capsule    Refill:  0  .  levothyroxine (SYNTHROID, LEVOTHROID) 75 MCG tablet    Sig: One every morning on a fasting stomach 1 hour prior to eating.    Dispense:  100 tablet    Refill:  1   Discussed the recommendations and risks of postmenopausal estrogen  Particularly has a concerns of vascular health.  Patient does not smoke and her cholesterol is well controlled.  She is due for mammogram and that has been ordered.  Levothyroxine dose will depend on TSH levels.  This discussed the potential estrogen wean at some point in the future.  Follow-up: Return in about 3 months (around 08/13/2017).  Mliss SaxWilliam Alfred Javonnie Illescas, MD

## 2017-05-14 ENCOUNTER — Encounter: Payer: Self-pay | Admitting: Family Medicine

## 2017-05-14 LAB — VITAMIN D 25 HYDROXY (VIT D DEFICIENCY, FRACTURES): VITD: 21.04 ng/mL — ABNORMAL LOW (ref 30.00–100.00)

## 2017-05-14 MED ORDER — LEVOTHYROXINE SODIUM 75 MCG PO TABS: ORAL_TABLET | ORAL | 1 refills | Status: DC

## 2017-05-14 MED ORDER — VITAMIN D (ERGOCALCIFEROL) 1.25 MG (50000 UNIT) PO CAPS: 50000.0000 [IU] | ORAL_CAPSULE | ORAL | 0 refills | Status: DC

## 2017-05-14 NOTE — Addendum Note (Signed)
Addended by: Nadene RubinsKREMER, Jermani Pund A on: 05/14/2017 11:19 AM   Modules accepted: Orders

## 2017-05-22 ENCOUNTER — Encounter (HOSPITAL_BASED_OUTPATIENT_CLINIC_OR_DEPARTMENT_OTHER): Payer: Self-pay

## 2017-05-22 ENCOUNTER — Ambulatory Visit (HOSPITAL_BASED_OUTPATIENT_CLINIC_OR_DEPARTMENT_OTHER)
Admission: RE | Admit: 2017-05-22 | Discharge: 2017-05-22 | Disposition: A | Discharge status: Home / Self Care | Payer: BLUE CROSS/BLUE SHIELD | Source: Ambulatory Visit | Attending: Family Medicine | Admitting: Family Medicine

## 2017-05-22 DIAGNOSIS — Z780 Asymptomatic menopausal state: Secondary | ICD-10-CM | POA: Insufficient documentation

## 2017-05-22 DIAGNOSIS — Z1231 Encounter for screening mammogram for malignant neoplasm of breast: Secondary | ICD-10-CM | POA: Insufficient documentation

## 2017-05-22 DIAGNOSIS — E559 Vitamin D deficiency, unspecified: Secondary | ICD-10-CM

## 2017-05-22 DIAGNOSIS — M8588 Other specified disorders of bone density and structure, other site: Secondary | ICD-10-CM | POA: Insufficient documentation

## 2017-05-22 DIAGNOSIS — Z1239 Encounter for other screening for malignant neoplasm of breast: Secondary | ICD-10-CM

## 2017-06-23 ENCOUNTER — Ambulatory Visit (INDEPENDENT_AMBULATORY_CARE_PROVIDER_SITE_OTHER): Payer: BLUE CROSS/BLUE SHIELD | Admitting: Family Medicine

## 2017-06-23 ENCOUNTER — Encounter: Payer: Self-pay | Admitting: Family Medicine

## 2017-06-23 ENCOUNTER — Ambulatory Visit: Payer: BLUE CROSS/BLUE SHIELD | Admitting: Family Medicine

## 2017-06-23 VITALS — BP 120/72 | HR 69 | Temp 97.6°F | Ht 64.5 in | Wt 175.2 lb

## 2017-06-23 DIAGNOSIS — Z7189 Other specified counseling: Secondary | ICD-10-CM

## 2017-06-23 DIAGNOSIS — S0006XA Insect bite (nonvenomous) of scalp, initial encounter: Secondary | ICD-10-CM | POA: Diagnosis not present

## 2017-06-23 DIAGNOSIS — W57XXXA Bitten or stung by nonvenomous insect and other nonvenomous arthropods, initial encounter: Secondary | ICD-10-CM

## 2017-06-23 DIAGNOSIS — E559 Vitamin D deficiency, unspecified: Secondary | ICD-10-CM | POA: Diagnosis not present

## 2017-06-23 DIAGNOSIS — Z0000 Encounter for general adult medical examination without abnormal findings: Secondary | ICD-10-CM | POA: Insufficient documentation

## 2017-06-23 DIAGNOSIS — M8580 Other specified disorders of bone density and structure, unspecified site: Secondary | ICD-10-CM | POA: Diagnosis not present

## 2017-06-23 MED ORDER — DOXYCYCLINE HYCLATE 100 MG PO TABS: 100.0000 mg | ORAL_TABLET | Freq: Two times a day (BID) | ORAL | 0 refills | Status: DC

## 2017-06-23 NOTE — Patient Instructions (Signed)
Tick Bite Information, Adult Ticks are insects that draw blood for food. Most ticks live in shrubs and grassy areas. They climb onto people and animals that brush against the leaves and grasses that they rest on. Then they bite, attaching themselves to the skin. Most ticks are harmless, but some ticks carry germs that can spread to a person through a bite and cause a disease. To reduce your risk of getting a disease from a tick bite, it is important to take steps to prevent tick bites. It is also important to check for ticks after being outdoors. If you find that a tick has attached to you, watch for symptoms of disease. How can I prevent tick bites? Take these steps to help prevent tick bites when you are outdoors in an area where ticks are found:  Use insect repellent that has DEET (20% or higher), picaridin, or IR3535 in it. Use it on: ? Skin that is showing. ? The top of your boots. ? Your pant legs. ? Your sleeve cuffs.  For repellent products that contain permethrin, follow product instructions. Use these products on: ? Clothing. ? Gear. ? Boots. ? Tents.  Wear protective clothing. Long sleeves and long pants offer the best protection from ticks.  Wear light-colored clothing so you can see ticks more easily.  Tuck your pant legs into your socks.  If you go walking on a trail, stay in the middle of the trail so your skin, hair, and clothing do not touch the bushes.  Avoid walking through areas with long grass.  Check for ticks on your clothing, hair, and skin often while you are outside, and check again before you go inside. Make sure to check the places that ticks attach themselves most often. These places include the scalp, neck, armpits, waist, groin, and joint areas. Ticks that carry a disease called Lyme disease have to be attached to the skin for 24-48 hours. Checking for ticks every day will lessen your risk of this and other diseases.  When you come indoors, wash your  clothes and take a shower or a bath right away. Dry your clothes in a dryer on high heat for at least 60 minutes. This will kill any ticks in your clothes.  What is the proper way to remove a tick? If you find a tick on your body, remove it as soon as possible. Removing a tick sooner rather than later can prevent germs from passing from the tick to your body. To remove a tick that is crawling on your skin but has not bitten:  Go outdoors and brush the tick off.  Remove the tick with tape or a lint roller.  To remove a tick that is attached to your skin:  Wash your hands.  If you have latex gloves, put them on.  Use tweezers, curved forceps, or a tick-removal tool to gently grasp the tick as close to your skin and the tick's head as possible.  Gently pull with steady, upward pressure until the tick lets go. When removing the tick: ? Take care to keep the tick's head attached to its body. ? Do not twist or jerk the tick. This can make the tick's head or mouth break off. ? Do not squeeze or crush the tick's body. This could force disease-carrying fluids from the tick into your body.  Do not try to remove a tick with heat, alcohol, petroleum jelly, or fingernail polish. Using these methods can cause the tick to salivate and   regurgitate into your bloodstream, increasing your risk of getting a disease. What should I do after removing a tick?  Clean the bite area with soap and water, rubbing alcohol, or an iodine scrub.  If an antiseptic cream or ointment is available, apply a small amount to the bite site.  Wash and disinfect any instruments that you used to remove the tick. How should I dispose of a tick? To dispose of a live tick, use one of these methods:  Place it in rubbing alcohol.  Place it in a sealed bag or container.  Wrap it tightly in tape.  Flush it down the toilet.  Contact a health care provider if:  You have symptoms of a disease after a tick bite. Symptoms of a  tick-borne disease can occur from moments after the tick bites to up to 30 days after a tick is removed. Symptoms include: ? Muscle, joint, or bone pain. ? Difficulty walking or moving your legs. ? Numbness in the legs. ? Paralysis. ? Red rash around the tick bite area that is shaped like a target or a "bull's-eye." ? Redness and swelling in the area of the tick bite. ? Fever. ? Repeated vomiting. ? Diarrhea. ? Weight loss. ? Tender, swollen lymph glands. ? Shortness of breath. ? Cough. ? Pain in the abdomen. ? Headache. ? Abnormal tiredness. ? A change in your level of consciousness. ? Confusion. Get help right away if:  You are not able to remove a tick.  A part of a tick breaks off and gets stuck in your skin.  Your symptoms get worse. Summary  Ticks may carry germs that can spread to a person through a bite and cause disease.  Wear protective clothing and use insect repellent to prevent tick bites. Follow product instructions.  If you find a tick on your body, remove it as soon as possible. If the tick is attached, do not try to remove with heat, alcohol, petroleum jelly, or fingernail polish.  Remove the attached tick using tweezers, curved forceps, or a tick-removal tool. Gently pull with steady, upward pressure until the tick lets go. Do not twist or jerk the tick. Do not squeeze or crush the tick's body.  If you have symptoms after being bitten by a tick, contact a health care provider. This information is not intended to replace advice given to you by your health care provider. Make sure you discuss any questions you have with your health care provider. Document Released: 02/16/2000 Document Revised: 12/01/2015 Document Reviewed: 12/01/2015 Elsevier Interactive Patient Education  2018 [employer]  Vitamin D Deficiency Vitamin D deficiency is when your body does not have enough vitamin D. Vitamin D is important because:  It helps your body use other minerals  that your body needs.  It helps keep your bones strong and healthy.  It may help to prevent some diseases.  It helps your heart and other muscles work well.  You can get vitamin D by:  Eating foods with vitamin D in them.  Drinking or eating milk or other foods that have had vitamin D added to them.  Taking a vitamin D supplement.  Being in the sun.  Not getting enough vitamin D can make your bones become soft. It can also cause other health problems. Follow these instructions at home:  Take medicines and supplements only as told by your doctor.  Eat foods that have vitamin D. These include: ? Dairy products, cereals, or juices with added vitamin D.  Check the label for vitamin D. ? Fatty fish like salmon or trout. ? Eggs. ? Oysters.  Do not use tanning beds.  Stay at a healthy weight. Lose weight, if needed.  Keep all follow-up visits as told by your doctor. This is important. Contact a doctor if:  Your symptoms do not go away.  You feel sick to your stomach (nauseous).  Youthrow up (vomit).  You poop less often than usual or you have trouble pooping (constipation). This information is not intended to replace advice given to you by your health care provider. Make sure you discuss any questions you have with your health care provider. Document Released: 02/07/2011 Document Revised: 07/27/2015 Document Reviewed: 07/06/2014 Elsevier Interactive Patient Education  2018 [employer] Inc.  Menopause and Hormone Replacement Therapy What is hormone replacement therapy? Hormone replacement therapy (HRT) is the use of artificial (synthetic) hormones to replace hormones that your body stops producing during menopause. Menopause is the normal time of life when menstrual periods stop completely and the ovaries stop producing the female hormones estrogen and progesterone. This lack of hormones can affect your health and cause undesirable symptoms. HRT can relieve some of those  symptoms. What are my options for HRT? HRT may consist of the synthetic hormones estrogen and progestin, or it may consist of only estrogen (estrogen-only therapy). You and your health care provider will decide which form of HRT is best for you. If you choose to be on HRT and you have a uterus, estrogen and progestin are usually prescribed. Estrogen-only therapy is used for women who do not have a uterus. Possible options for taking HRT include:  Pills.  Patches.  Gels.  Sprays.  Vaginal cream.  Vaginal rings.  Vaginal inserts.  The amount of hormone(s) that you take and how long you take the hormone(s) varies depending on your individual health. It is important to:  Begin HRT with the lowest possible dosage.  Stop HRT as soon as your health care provider tells you to stop.  Work with your health care provider so that you feel informed and comfortable with your decisions.  What are the benefits of HRT? HRT can reduce the frequency and severity of menopausal symptoms. Benefits of HRT vary depending on the menopausal symptoms that you have, the severity of your symptoms, and your overall health. HRT may help to improve the following menopausal symptoms:  Hot flashes and night sweats. These are sudden feelings of heat that spread over the face and body. The skin may turn red, like a blush. Night sweats are hot flashes that happen while you are sleeping or trying to sleep.  Bone loss (osteoporosis). The body loses calcium more quickly after menopause, causing the bones to become weaker. This can increase the risk for bone breaks (fractures).  Vaginal dryness. The lining of the vagina can become thin and dry, which can cause pain during sexual intercourse or cause infection, burning, or itching.  Urinary tract infections.  Urinary incontinence. This is a decreased ability to control when you urinate.  Irritability.  Short-term memory problems.  What are the risks of  HRT? Risks of HRT vary depending on your individual health and medical history. Risks of HRT also depend on whether you receive both estrogen and progestin or you receive estrogen only.HRT may increase the risk of:  Spotting. This is when a small amount of bloodleaks from the vagina unexpectedly.  Endometrial cancer. This cancer is in the lining of the uterus (endometrium).  Breast cancer.  Increased density of breast tissue. This can make it harder to find breast cancer on a breast X-ray (mammogram).  Stroke.  Heart attack.  Blood clots.  Gallbladder disease.  Risks of HRT can increase if you have any of the following conditions:  Endometrial cancer.  Liver disease.  Heart disease.  Breast cancer.  History of blood clots.  History of stroke.  How should I care for myself while I am on HRT?  Take over-the-counter and prescription medicines only as told by your health care provider.  Get mammograms, pelvic exams, and medical checkups as often as told by your health care provider.  Have Pap tests done as often as told by your health care provider. A Pap test is sometimes called a Pap smear. It is a screening test that is used to check for signs of cancer of the cervix and vagina. A Pap test can also identify the presence of infection or precancerous changes. Pap tests may be done: ? Every 3 years, starting at age 110. ? Every 5 years, starting after age 12, in combination with testing for human papillomavirus (HPV). ? More often or less often depending on other medical conditions you have, your age, and other risk factors.  It is your responsibility to get your Pap test results. Ask your health care provider or the department performing the test when your results will be ready.  Keep all follow-up visits as told by your health care provider. This is important. When should I seek medical care? Talk with your health care provider if:  You have any of these: ? Pain or  swelling in your legs. ? Shortness of breath. ? Chest pain. ? Lumps or changes in your breasts or armpits. ? Slurred speech. ? Pain, burning, or bleeding when you urine.  You develop any of these: ? Unusual vaginal bleeding. ? Dizziness or headaches. ? Weakness or numbness in any part of your arms or legs. ? Pain in your abdomen.  This information is not intended to replace advice given to you by your health care provider. Make sure you discuss any questions you have with your health care provider. Document Released: 11/17/2002 Document Revised: 01/16/2016 Document Reviewed: 08/22/2014 Elsevier Interactive Patient Education  2017 [employer].

## 2017-06-23 NOTE — Progress Notes (Signed)
Subjective:  Patient ID: Brianna Luna, female    DOB: 01/10/1956  Age: 62 y.o. MRN: [id number]  CC: Tick Removal   HPI Brianna Lorelisann Renaud presents for follow-up having found a tick in her scalp yesterday.  She also noticed a rash in her scalp just above her neck line on the right side.  She denies headaches, fevers chills rashes in other areas of joint aches and pains or myalgias.  She had been hiking in [place] last week.  She takes her dog on walks in the local woods commonly here too.  She pulled off a tick yesterday and has it for me to see in a baggy.  It is a dog tick.  She also wants to discuss her treatment for osteopenia.  Her sister had suggested a pill that has 1200 mg of calcium with thousand international units of vitamin D.  She wonders if she should take this in addition to the high-dose vitamin D prescribed.  Outpatient Medications Prior to Visit  Medication Sig Dispense Refill  . atorvastatin (LIPITOR) 20 MG tablet Take one daily 100 tablet 1  . estradiol (ESTRACE) 0.5 MG tablet Take 1 tablet (0.5 mg total) by mouth 2 (two) times daily. 180 tablet 1  . levothyroxine (SYNTHROID, LEVOTHROID) 75 MCG tablet One every morning on a fasting stomach 1 hour prior to eating. 100 tablet 1  . Vitamin D, Ergocalciferol, (DRISDOL) 50000 units CAPS capsule Take 1 capsule (50,000 Units total) by mouth every 7 (seven) days for 30 doses. 30 capsule 0   No facility-administered medications prior to visit.     ROS Review of Systems  Constitutional: Negative for chills, fatigue, fever and unexpected weight change.  HENT: Negative.   Eyes: Negative.  Negative for photophobia and visual disturbance.  Respiratory: Negative.   Cardiovascular: Negative.   Gastrointestinal: Negative.   Endocrine: Negative for cold intolerance and heat intolerance.  Musculoskeletal: Negative for arthralgias and myalgias.  Skin: Positive for color change and rash.  Neurological: Negative for weakness and  headaches.  Hematological: Does not bruise/bleed easily.  Psychiatric/Behavioral: Negative.     Objective:  BP 120/72 (BP Location: Right Arm, Patient Position: Sitting, Cuff Size: Normal)   Pulse 69   Temp 97.6 F (36.4 C) (Oral)   Ht 5' 4.5" (1.638 m)   Wt 175 lb 4 oz (79.5 kg)   SpO2 96%   BMI 29.62 kg/m   BP Readings from Last 3 Encounters:  06/23/17 120/72  05/13/17 110/80    Wt Readings from Last 3 Encounters:  06/23/17 175 lb 4 oz (79.5 kg)  05/13/17 175 lb 9.6 oz (79.7 kg)    Physical Exam  Constitutional: She is oriented to person, place, and time. She appears well-developed and well-nourished. No distress.  HENT:  Head: Normocephalic and atraumatic.  Right Ear: External ear normal.  Left Ear: External ear normal.  Nose: Nose normal.  Mouth/Throat: Oropharynx is clear and moist. No oropharyngeal exudate.  Eyes: Pupils are equal, round, and reactive to light. Conjunctivae and EOM are normal. Right eye exhibits no discharge. Left eye exhibits no discharge. No scleral icterus.  Neck: Normal range of motion. Neck supple. No JVD present. No tracheal deviation present. No thyromegaly present.  Cardiovascular: Normal rate, regular rhythm and normal heart sounds.  Pulmonary/Chest: Effort normal and breath sounds normal.  Lymphadenopathy:    She has no cervical adenopathy.  Neurological: She is alert and oriented to person, place, and time.  Skin: Skin is warm and dry. Rash  noted. She is not diaphoretic.     Psychiatric: She has a normal mood and affect. Her behavior is normal. Thought content normal.    Lab Results  Component Value Date   WBC 4.8 05/13/2017   HGB 14.3 05/13/2017   HCT 42.3 05/13/2017   PLT 224.0 05/13/2017   GLUCOSE 104 (H) 05/13/2017   CHOL 158 05/13/2017   TRIG 118.0 05/13/2017   HDL 64.70 05/13/2017   LDLCALC 69 05/13/2017   ALT 18 05/13/2017   AST 19 05/13/2017   NA 145 05/13/2017   K 4.9 05/13/2017   CL 107 05/13/2017   CREATININE  0.86 05/13/2017   BUN 14 05/13/2017   CO2 31 05/13/2017   TSH 1.36 05/13/2017    Dg Bone Density  Result Date: 05/22/2017 EXAM: DUAL X-RAY ABSORPTIOMETRY (DXA) FOR BONE MINERAL DENSITY IMPRESSION: Referring Physician:  Talmadge [facility]entry [facility] PATIENT: Name: Brianna Luna, Brianna Luna Patient ID: [id number] Birth Date: 18-Jul-1955 Height: 66.0 in. Sex: Female Measured: 05/22/2017 Weight: 176.4 lbs. Indications: Caucasian, Estrogen Deficiency, Hypothyroidism, Hysterectomy, Post Menopausal Fractures: Treatments: Estriadol, Levothyroxine, Vitamin D ASSESSMENT: The BMD measured at AP Spine L1-L4 is 0.975 g/cm2 with a T-score of -1.7. This patient is considered osteopenic according to World Health Organization [facility]) criteria. Site Region Measured Date Measured Age WHO YA BMD Classification T-score AP Spine L1-L4 05/22/2017 62.3 Osteopenia -1.7 0.975 g/cm2 DualFemur Neck Left 4/22/2017 62.3 years Normal -0.8 0.929 g/cm2 World Health Organization [facility]) criteria for post-menopausal, Caucasian Women: Normal       T-score at or above -1 SD Osteopenia   T-score between -1 and -2.5 SD Osteoporosis T-score at or below -2.5 SD RECOMMENDATION:National Osteoporosis Foundation recommends that FDA-approved medical therapies be considered in postmenopausal women and men age 48 or older with a: 1. Hip or vertebral (clinical or morphometric) fracture. 2. T-score of < -2.5 at the spine or hip. 3. Ten-year fracture probability by FRAX of 3% or greater for hip fracture or 20% or greater for major osteoporotic fracture. All treatment decisions require clinical judgment and consideration of individual patient factors, including patient preferences, co-morbidities, previous drug use, risk factors not captured in the FRAX model (e.g. falls, vitamin D deficiency, increased bone turnover, interval significant decline in bone density) and possible under - or over-estimation of fracture risk by FRAX. All patients should ensure an adequate intake of dietary  calcium (1200 mg/d) and vitamin D (800 IU daily) unless contraindicated. FOLLOW-UP: People with diagnosed cases of osteoporosis or at high risk for fracture should have regular bone mineral density tests. For patients eligible for Medicare, routine testing is allowed once every 2 years. The testing frequency can be increased to one year for patients who have rapidly progressing disease, those who are receiving or discontinuing medical therapy to restore bone mass, or have additional risk factors. I have reviewed this report and agree with the above findings. [facility] Radiology Electronically Signed   By: Amie Portland M.D.   On: 05/22/2017 12:26   Mm Screening Breast Tomo Bilateral  Result Date: 05/22/2017 CLINICAL DATA:  Screening. EXAM: DIGITAL SCREENING BILATERAL MAMMOGRAM WITH TOMO AND CAD COMPARISON:  Previous exam(s). ACR Breast Density Category b: There are scattered areas of fibroglandular density. FINDINGS: There are no findings suspicious for malignancy. Images were processed with CAD. IMPRESSION: No mammographic evidence of malignancy. A result letter of this screening mammogram will be mailed directly to the patient. RECOMMENDATION: Screening mammogram in one year. (Code:[id number]-B-01Y) BI-RADS CATEGORY  1: Negative. Electronically Signed   By: Harmon Pier  M.D.   On: 05/22/2017 15:27    Assessment & Plan:   Marzell was seen today for tick removal.  Diagnoses and all orders for this visit:  Vitamin D deficiency  Osteopenia, unspecified location  Tick bite, initial encounter -     doxycycline (VIBRA-TABS) 100 MG tablet; Take 1 tablet (100 mg total) by mouth 2 (two) times daily.  Counseling for estrogen replacement therapy   I am having Shenequa Hammar start on doxycycline. I am also having her maintain her atorvastatin, estradiol, Vitamin D (Ergocalciferol), and levothyroxine.  Meds ordered this encounter  Medications  . doxycycline (VIBRA-TABS) 100 MG tablet    Sig: Take 1 tablet  (100 mg total) by mouth 2 (two) times daily.    Dispense:  20 tablet    Refill:  0   She will take the pill without thousand  international units of vitamin D and 1200 mg of calcium daily in addition to the high-dose vitamin D that I have given her.  Information was given to her to take disease.  We discussed ongoing treatment of menopause with estrogen replacement.  She is aware that she is at somewhat increased risk for heart disease but her cholesterol is treated she has never smoked and she does not have high blood pressure.  She tells me that the estrogen replacement has helped immensely with her sleep.  She is also aware of sleep hygiene and will add melatonin 5 mg.  We will recheck her vitamin D with her routine labs at her next office visit.  Follow-up: Return if symptoms worsen or fail to improve.  Mliss Sax, MD

## 2017-09-07 ENCOUNTER — Other Ambulatory Visit: Payer: Self-pay | Admitting: Family Medicine

## 2017-09-07 DIAGNOSIS — E7800 Pure hypercholesterolemia, unspecified: Secondary | ICD-10-CM

## 2017-10-21 ENCOUNTER — Ambulatory Visit (INDEPENDENT_AMBULATORY_CARE_PROVIDER_SITE_OTHER): Payer: BLUE CROSS/BLUE SHIELD | Admitting: Family Medicine

## 2017-10-21 ENCOUNTER — Encounter: Payer: Self-pay | Admitting: Family Medicine

## 2017-10-21 VITALS — BP 120/70 | HR 77 | Ht 64.5 in | Wt 175.5 lb

## 2017-10-21 DIAGNOSIS — G43109 Migraine with aura, not intractable, without status migrainosus: Secondary | ICD-10-CM | POA: Diagnosis not present

## 2017-10-21 DIAGNOSIS — B029 Zoster without complications: Secondary | ICD-10-CM

## 2017-10-21 DIAGNOSIS — B001 Herpesviral vesicular dermatitis: Secondary | ICD-10-CM

## 2017-10-21 DIAGNOSIS — R29818 Other symptoms and signs involving the nervous system: Secondary | ICD-10-CM

## 2017-10-21 DIAGNOSIS — R208 Other disturbances of skin sensation: Secondary | ICD-10-CM | POA: Diagnosis not present

## 2017-10-21 MED ORDER — VALACYCLOVIR HCL 1 G PO TABS: 1000.0000 mg | ORAL_TABLET | Freq: Three times a day (TID) | ORAL | 0 refills | Status: DC

## 2017-10-21 NOTE — Progress Notes (Signed)
Subjective:  Patient ID: Brianna Luna, female    DOB: 06/09/1955  Age: 62 y.o. MRN: [id number]  CC: Referral   HPI Brianna Luna presents for evaluation of an outbreak of painful blisters around the right side of Brianna Luna mouth, palate and scalp.  Brianna Luna does have a history of fever blisters but they have never involved anywhere other than the peri-aural area.  Brianna Luna has been under stress ranging planning Brianna Luna father-in-law's 90th birthday party upper in [place].  There was no antecedent pain before the outbreak of the sores.  Brianna Luna did have a headache yesterday that was preceded by flashing lights and relieved with Excedrin Migraine migraine in a dark space.  This is Brianna Luna usual migraine that Brianna Luna has 2-3 times a year.  Brianna Luna also gives a 3 to 7440-month history of random migratory transient dysesthesias in point locations about Brianna Luna entire body.  Brianna Luna denies any paresthesias, diplopia or headaches other than Brianna Luna usual migraines.  Outpatient Medications Prior to Visit  Medication Sig Dispense Refill  . atorvastatin (LIPITOR) 20 MG tablet TAKE ONE TABLET BY MOUTH DAILY 90 tablet 0  . estradiol (ESTRACE) 0.5 MG tablet Take 1 tablet (0.5 mg total) by mouth 2 (two) times daily. 180 tablet 1  . levothyroxine (SYNTHROID, LEVOTHROID) 75 MCG tablet One every morning on a fasting stomach 1 hour prior to eating. 100 tablet 1  . Vitamin D, Ergocalciferol, (DRISDOL) 50000 units CAPS capsule Take 1 capsule (50,000 Units total) by mouth every 7 (seven) days for 30 doses. 30 capsule 0  . doxycycline (VIBRA-TABS) 100 MG tablet Take 1 tablet (100 mg total) by mouth 2 (two) times daily. 20 tablet 0   No facility-administered medications prior to visit.     ROS Review of Systems  Constitutional: Negative.  Negative for chills, fatigue, fever and unexpected weight change.  HENT: Negative.   Eyes: Negative.   Respiratory: Negative.   Cardiovascular: Negative.   Gastrointestinal: Negative.   Endocrine: Negative for  polyphagia and polyuria.  Genitourinary: Negative.   Musculoskeletal: Negative for arthralgias and myalgias.  Skin: Positive for color change and rash. Negative for wound.  Allergic/Immunologic: Negative for immunocompromised state.  Neurological: Positive for headaches. Negative for dizziness, tremors, seizures, facial asymmetry, speech difficulty, weakness, light-headedness and numbness.  Hematological: Does not bruise/bleed easily.  Psychiatric/Behavioral: Negative.     Objective:  BP 120/70   Pulse 77   Ht 5' 4.5" (1.638 m)   Wt 175 lb 8 oz (79.6 kg)   SpO2 97%   BMI 29.66 kg/m   BP Readings from Last 3 Encounters:  10/21/17 120/70  06/23/17 120/72  05/13/17 110/80    Wt Readings from Last 3 Encounters:  10/21/17 175 lb 8 oz (79.6 kg)  06/23/17 175 lb 4 oz (79.5 kg)  05/13/17 175 lb 9.6 oz (79.7 kg)    Physical Exam  Constitutional: Brianna Luna is oriented to person, place, and time. Brianna Luna appears well-developed and well-nourished. No distress.  HENT:  Head: Normocephalic and atraumatic.  Right Ear: External ear normal.  Left Ear: External ear normal.  Nose: Nose normal.  Mouth/Throat: Oropharynx is clear and moist. No oropharyngeal exudate.    Eyes: Pupils are equal, round, and reactive to light. Conjunctivae and EOM are normal. Right eye exhibits no discharge. Left eye exhibits no discharge. No scleral icterus.  Neck: Normal range of motion. Neck supple. No JVD present. No tracheal deviation present. No thyromegaly present.  Cardiovascular: Normal rate, regular rhythm and normal heart sounds.  Pulmonary/Chest: Effort normal and breath sounds normal.  Lymphadenopathy:    Brianna Luna has no cervical adenopathy.  Neurological: Brianna Luna is alert and oriented to person, place, and time.  Skin: Skin is warm and dry. Rash noted. Brianna Luna is not diaphoretic.     Psychiatric: Brianna Luna has a normal mood and affect. Brianna Luna behavior is normal.    Lab Results  Component Value Date   WBC 4.8 05/13/2017     HGB 14.3 05/13/2017   HCT 42.3 05/13/2017   PLT 224.0 05/13/2017   GLUCOSE 104 (H) 05/13/2017   CHOL 158 05/13/2017   TRIG 118.0 05/13/2017   HDL 64.70 05/13/2017   LDLCALC 69 05/13/2017   ALT 18 05/13/2017   AST 19 05/13/2017   NA 145 05/13/2017   K 4.9 05/13/2017   CL 107 05/13/2017   CREATININE 0.86 05/13/2017   BUN 14 05/13/2017   CO2 31 05/13/2017   TSH 1.36 05/13/2017    Dg Bone Density  Result Date: 05/22/2017 EXAM: DUAL X-RAY ABSORPTIOMETRY (DXA) FOR BONE MINERAL DENSITY IMPRESSION: Referring Physician:  Talmadge CoventryWILLIAM ALFRED [facility]IENT: Name: Brianna LoreMarshall, Charle Patient ID: [id number] Birth Date: 11/20/1955 Height: 66.0 in. Sex: Female Measured: 05/22/2017 Weight: 176.4 lbs. Indications: Caucasian, Estrogen Deficiency, Hypothyroidism, Hysterectomy, Post Menopausal Fractures: Treatments: Estriadol, Levothyroxine, Vitamin D ASSESSMENT: The BMD measured at AP Spine L1-L4 is 0.975 g/cm2 with a T-score of -1.7. This patient is considered osteopenic according to World Health Organization [facility](WHO) criteria. Site Region Measured Date Measured Age WHO YA BMD Classification T-score AP Spine L1-L4 05/22/2017 62.3 Osteopenia -1.7 0.975 g/cm2 DualFemur Neck Left 4/22/2017 62.3 years Normal -0.8 0.929 g/cm2 World Health Organization [facility](WHO) criteria for post-menopausal, Caucasian Women: Normal       T-score at or above -1 SD Osteopenia   T-score between -1 and -2.5 SD Osteoporosis T-score at or below -2.5 SD RECOMMENDATION:National Osteoporosis Foundation recommends that FDA-approved medical therapies be considered in postmenopausal women and men age 63 or older with a: 1. Hip or vertebral (clinical or morphometric) fracture. 2. T-score of < -2.5 at the spine or hip. 3. Ten-year fracture probability by FRAX of 3% or greater for hip fracture or 20% or greater for major osteoporotic fracture. All treatment decisions require clinical judgment and consideration of individual patient factors, including patient  preferences, co-morbidities, previous drug use, risk factors not captured in the FRAX model (e.g. falls, vitamin D deficiency, increased bone turnover, interval significant decline in bone density) and possible under - or over-estimation of fracture risk by FRAX. All patients should ensure an adequate intake of dietary calcium (1200 mg/d) and vitamin D (800 IU daily) unless contraindicated. FOLLOW-UP: People with diagnosed cases of osteoporosis or at high risk for fracture should have regular bone mineral density tests. For patients eligible for Medicare, routine testing is allowed once every 2 years. The testing frequency can be increased to one year for patients who have rapidly progressing disease, those who are receiving or discontinuing medical therapy to restore bone mass, or have additional risk factors. I have reviewed this report and agree with the above findings. [facility] Electronically Signed   By: Amie Portlandavid  Ormond M.D.   On: 05/22/2017 12:26   Mm Screening Breast Tomo Bilateral  Result Date: 05/22/2017 CLINICAL DATA:  Screening. EXAM: DIGITAL SCREENING BILATERAL MAMMOGRAM WITH TOMO AND CAD COMPARISON:  Previous exam(s). ACR Breast Density Category b: There are scattered areas of fibroglandular density. FINDINGS: There are no findings suspicious for malignancy. Images were processed with CAD. IMPRESSION: No mammographic evidence of  malignancy. A result letter of this screening mammogram will be mailed directly to the patient. RECOMMENDATION: Screening mammogram in one year. (Code:SM-B-01Y) BI-RADS CATEGORY  1: Negative. Electronically Signed   By: Harmon Pier M.D.   On: 05/22/2017 15:27    Assessment & Plan:   Brianna Luna was seen today for referral.  Diagnoses and all orders for this visit:  Herpes zoster without complication -     valACYclovir (VALTREX) 1000 MG tablet; Take 1 tablet (1,000 mg total) by mouth 3 (three) times daily for 7 days.  Fever blister -     valACYclovir  (VALTREX) 1000 MG tablet; Take 1 tablet (1,000 mg total) by mouth 3 (three) times daily for 7 days.  Migraine with aura and without status migrainosus, not intractable  Dysesthesia affecting both sides of body -     MR Brain W Wo Contrast; Future  Other symptoms and signs involving the nervous system   I have discontinued Brianna Luna doxycycline. I am also having Brianna Luna start on valACYclovir. Additionally, I am having Brianna Luna maintain Brianna Luna estradiol, Vitamin D (Ergocalciferol), levothyroxine, and atorvastatin.  Meds ordered this encounter  Medications  . valACYclovir (VALTREX) 1000 MG tablet    Sig: Take 1 tablet (1,000 mg total) by mouth 3 (three) times daily for 7 days.    Dispense:  21 tablet    Refill:  0   Go ahead and treat for possible zoster.  We will send in a standing prescription for Valtrex as needed for future fever blisters.  New onset of chronic migratory dysesthesias are somewhat alarming.  Have ordered MRI to rule out MS.  Follow-up: Return if symptoms worsen or fail to improve.  Mliss Sax, MD

## 2017-10-21 NOTE — Patient Instructions (Signed)
Shingles Shingles, which is also known as herpes zoster, is an infection that causes a painful skin rash and fluid-filled blisters. Shingles is not related to genital herpes, which is a sexually transmitted infection. Shingles only develops in people who:  Have had chickenpox.  Have received the chickenpox vaccine. (This is rare.)  What are the causes? Shingles is caused by varicella-zoster virus (VZV). This is the same virus that causes chickenpox. After exposure to VZV, the virus stays in the body in an inactive (dormant) state. Shingles develops if the virus reactivates. This can happen many years after the initial exposure to VZV. It is not known what causes this virus to reactivate. What increases the risk? People who have had chickenpox or received the chickenpox vaccine are at risk for shingles. Infection is more common in people who:  Are older than age 50.  Have a weakened defense (immune) system, such as those with HIV, AIDS, or cancer.  Are taking medicines that weaken the immune system, such as transplant medicines.  Are under great stress.  What are the signs or symptoms? Early symptoms of this condition include itching, tingling, and pain in an area on your skin. Pain may be described as burning, stabbing, or throbbing. A few days or weeks after symptoms start, a painful red rash appears, usually on one side of the body in a bandlike or beltlike pattern. The rash eventually turns into fluid-filled blisters that break open, scab over, and dry up in about 2-3 weeks. At any time during the infection, you may also develop:  A fever.  Chills.  A headache.  An upset stomach.  How is this diagnosed? This condition is diagnosed with a skin exam. Sometimes, skin or fluid samples are taken from the blisters before a diagnosis is made. These samples are examined under a microscope or sent to a lab for testing. How is this treated? There is no specific cure for this condition.  Your health care provider will probably prescribe medicines to help you manage pain, recover more quickly, and avoid long-term problems. Medicines may include:  Antiviral drugs.  Anti-inflammatory drugs.  Pain medicines.  If the area involved is on your face, you may be referred to a specialist, such as an eye doctor (ophthalmologist) or an ear, nose, and throat (ENT) doctor to help you avoid eye problems, chronic pain, or disability. Follow these instructions at home: Medicines  Take medicines only as directed by your health care provider.  Apply an anti-itch or numbing cream to the affected area as directed by your health care provider. Blister and Rash Care  Take a cool bath or apply cool compresses to the area of the rash or blisters as directed by your health care provider. This may help with pain and itching.  Keep your rash covered with a loose bandage (dressing). Wear loose-fitting clothing to help ease the pain of material rubbing against the rash.  Keep your rash and blisters clean with mild soap and cool water or as directed by your health care provider.  Check your rash every day for signs of infection. These include redness, swelling, and pain that lasts or increases.  Do not pick your blisters.  Do not scratch your rash. General instructions  Rest as directed by your health care provider.  Keep all follow-up visits as directed by your health care provider. This is important.  Until your blisters scab over, your infection can cause chickenpox in people who have never had it or been vaccinated   against it. To prevent this from happening, avoid contact with other people, especially: ? Babies. ? Pregnant women. ? Children who have eczema. ? Elderly people who have transplants. ? People who have chronic illnesses, such as leukemia or AIDS. Contact a health care provider if:  Your pain is not relieved with prescribed medicines.  Your pain does not get better after  the rash heals.  Your rash looks infected. Signs of infection include redness, swelling, and pain that lasts or increases. Get help right away if:  The rash is on your face or nose.  You have facial pain, pain around your eye area, or loss of feeling on one side of your face.  You have ear pain or you have ringing in your ear.  You have loss of taste.  Your condition gets worse. This information is not intended to replace advice given to you by your health care provider. Make sure you discuss any questions you have with your health care provider. Document Released: 02/18/2005 Document Revised: 10/15/2015 Document Reviewed: 12/30/2013 Elsevier Interactive Patient Education  2018 Elsevier Inc.  

## 2017-10-27 ENCOUNTER — Other Ambulatory Visit: Payer: Self-pay | Admitting: Family Medicine

## 2017-10-27 DIAGNOSIS — E559 Vitamin D deficiency, unspecified: Secondary | ICD-10-CM

## 2017-11-04 ENCOUNTER — Ambulatory Visit
Admission: RE | Admit: 2017-11-04 | Discharge: 2017-11-04 | Disposition: A | Discharge status: Home / Self Care | Payer: BLUE CROSS/BLUE SHIELD | Source: Ambulatory Visit | Attending: Family Medicine | Admitting: Family Medicine

## 2017-11-04 DIAGNOSIS — R208 Other disturbances of skin sensation: Secondary | ICD-10-CM

## 2017-11-04 DIAGNOSIS — R202 Paresthesia of skin: Secondary | ICD-10-CM | POA: Diagnosis not present

## 2017-11-04 MED ORDER — GADOBENATE DIMEGLUMINE 529 MG/ML IV SOLN
15.0000 mL | Freq: Once | INTRAVENOUS | Status: AC | PRN
  Administered 2017-11-04: 15 mL via INTRAVENOUS

## 2017-12-06 ENCOUNTER — Other Ambulatory Visit: Payer: Self-pay | Admitting: Family Medicine

## 2017-12-06 DIAGNOSIS — E7800 Pure hypercholesterolemia, unspecified: Secondary | ICD-10-CM

## 2017-12-06 DIAGNOSIS — E039 Hypothyroidism, unspecified: Secondary | ICD-10-CM

## 2017-12-07 ENCOUNTER — Other Ambulatory Visit: Payer: Self-pay | Admitting: Family Medicine

## 2017-12-07 DIAGNOSIS — Z780 Asymptomatic menopausal state: Secondary | ICD-10-CM

## 2018-01-22 ENCOUNTER — Other Ambulatory Visit: Payer: Self-pay | Admitting: Family Medicine

## 2018-01-22 DIAGNOSIS — E559 Vitamin D deficiency, unspecified: Secondary | ICD-10-CM

## 2018-03-05 ENCOUNTER — Other Ambulatory Visit: Payer: Self-pay | Admitting: Family Medicine

## 2018-03-05 DIAGNOSIS — E7800 Pure hypercholesterolemia, unspecified: Secondary | ICD-10-CM

## 2018-03-05 DIAGNOSIS — E039 Hypothyroidism, unspecified: Secondary | ICD-10-CM

## 2018-04-17 ENCOUNTER — Other Ambulatory Visit: Payer: Self-pay | Admitting: Family Medicine

## 2018-04-17 DIAGNOSIS — E559 Vitamin D deficiency, unspecified: Secondary | ICD-10-CM

## 2018-04-29 DIAGNOSIS — M79671 Pain in right foot: Secondary | ICD-10-CM | POA: Diagnosis not present

## 2018-04-29 DIAGNOSIS — M542 Cervicalgia: Secondary | ICD-10-CM | POA: Diagnosis not present

## 2018-05-05 DIAGNOSIS — M79671 Pain in right foot: Secondary | ICD-10-CM | POA: Diagnosis not present

## 2018-05-05 DIAGNOSIS — M542 Cervicalgia: Secondary | ICD-10-CM | POA: Diagnosis not present

## 2018-05-12 DIAGNOSIS — M542 Cervicalgia: Secondary | ICD-10-CM | POA: Diagnosis not present

## 2018-05-12 DIAGNOSIS — M79671 Pain in right foot: Secondary | ICD-10-CM | POA: Diagnosis not present

## 2018-05-20 ENCOUNTER — Ambulatory Visit (INDEPENDENT_AMBULATORY_CARE_PROVIDER_SITE_OTHER): Payer: BLUE CROSS/BLUE SHIELD | Admitting: Family Medicine

## 2018-05-20 ENCOUNTER — Other Ambulatory Visit: Payer: Self-pay

## 2018-05-20 ENCOUNTER — Encounter: Payer: Self-pay | Admitting: Family Medicine

## 2018-05-20 VITALS — BP 136/80 | HR 66 | Ht 64.5 in | Wt 178.4 lb

## 2018-05-20 DIAGNOSIS — Z0000 Encounter for general adult medical examination without abnormal findings: Secondary | ICD-10-CM | POA: Diagnosis not present

## 2018-05-20 DIAGNOSIS — E039 Hypothyroidism, unspecified: Secondary | ICD-10-CM | POA: Diagnosis not present

## 2018-05-20 DIAGNOSIS — E559 Vitamin D deficiency, unspecified: Secondary | ICD-10-CM

## 2018-05-20 DIAGNOSIS — M8580 Other specified disorders of bone density and structure, unspecified site: Secondary | ICD-10-CM

## 2018-05-20 DIAGNOSIS — E7800 Pure hypercholesterolemia, unspecified: Secondary | ICD-10-CM | POA: Diagnosis not present

## 2018-05-20 DIAGNOSIS — S46811A Strain of other muscles, fascia and tendons at shoulder and upper arm level, right arm, initial encounter: Secondary | ICD-10-CM

## 2018-05-20 LAB — COMPREHENSIVE METABOLIC PANEL
ALBUMIN: 4.6 g/dL (ref 3.5–5.2)
ALT: 19 U/L (ref 0–35)
AST: 19 U/L (ref 0–37)
Alkaline Phosphatase: 84 U/L (ref 39–117)
BILIRUBIN TOTAL: 0.7 mg/dL (ref 0.2–1.2)
BUN: 18 mg/dL (ref 6–23)
CALCIUM: 9.4 mg/dL (ref 8.4–10.5)
CO2: 27 meq/L (ref 19–32)
Chloride: 105 mEq/L (ref 96–112)
Creatinine, Ser: 0.85 mg/dL (ref 0.40–1.20)
GFR: 67.47 mL/min (ref >60.00)
Glucose, Bld: 102 mg/dL — ABNORMAL HIGH (ref 70–99)
Potassium: 4 mEq/L (ref 3.5–5.1)
Sodium: 140 mEq/L (ref 135–145)
Total Protein: 7.2 g/dL (ref 6.0–8.3)

## 2018-05-20 LAB — CBC
HCT: 40.1 % (ref 36.0–46.0)
HEMOGLOBIN: 13.8 g/dL (ref 12.0–15.0)
MCHC: 34.3 g/dL (ref 30.0–36.0)
MCV: 91.3 fl (ref 78.0–100.0)
PLATELETS: 229 10*3/uL (ref 150.0–400.0)
RBC: 4.39 Mil/uL (ref 3.87–5.11)
RDW: 13.1 % (ref 11.5–15.5)
WBC: 5.4 10*3/uL (ref 4.0–10.5)

## 2018-05-20 LAB — LIPID PANEL
Cholesterol: 179 mg/dL (ref 0–200)
HDL: 69.1 mg/dL (ref >39.00)
LDL CALC: 88 mg/dL (ref 0–99)
NonHDL: 109.44
Total CHOL/HDL Ratio: 3
Triglycerides: 105 mg/dL (ref 0.0–149.0)
VLDL: 21 mg/dL (ref 0.0–40.0)

## 2018-05-20 LAB — URINALYSIS, ROUTINE W REFLEX MICROSCOPIC
Bilirubin Urine: NEGATIVE
Hgb urine dipstick: NEGATIVE
Ketones, ur: NEGATIVE
Nitrite: NEGATIVE
RBC / HPF: NONE SEEN (ref 0–?)
Specific Gravity, Urine: 1.02 (ref 1.000–1.030)
Total Protein, Urine: NEGATIVE
Urine Glucose: NEGATIVE
Urobilinogen, UA: 0.2 (ref 0.0–1.0)
pH: 6.5 (ref 5.0–8.0)

## 2018-05-20 LAB — TSH: TSH: 1.57 u[IU]/mL (ref 0.35–4.50)

## 2018-05-20 LAB — VITAMIN D 25 HYDROXY (VIT D DEFICIENCY, FRACTURES): VITD: 34.48 ng/mL (ref 30.00–100.00)

## 2018-05-20 MED ORDER — ATORVASTATIN CALCIUM 20 MG PO TABS: 20.0000 mg | ORAL_TABLET | Freq: Every day | ORAL | 1 refills | Status: DC

## 2018-05-20 MED ORDER — VITAMIN D (ERGOCALCIFEROL) 1.25 MG (50000 UNIT) PO CAPS: ORAL_CAPSULE | ORAL | 0 refills | Status: DC

## 2018-05-20 MED ORDER — LEVOTHYROXINE SODIUM 75 MCG PO TABS: ORAL_TABLET | ORAL | 4 refills | Status: DC

## 2018-05-20 MED ORDER — METHOCARBAMOL 500 MG PO TABS: 500.0000 mg | ORAL_TABLET | Freq: Three times a day (TID) | ORAL | 0 refills | Status: DC | PRN

## 2018-05-20 NOTE — Progress Notes (Addendum)
Established Patient Office Visit  Subjective:  Patient ID: Brianna Luna, female    DOB: 08/28/1955  Age: 63 y.o. MRN: [id number]  CC:  Chief Complaint  Patient presents with  . Annual Exam    HPI Avrey Flanagin presents for follow-up of her elevated cholesterol hypothyroidism and vitamin D deficiency.  She continues to take her levothyroxine in the morning before breakfast.  She remains on high-dose vitamin D and is also taking Os-Cal for her osteo-pia.  She is having no issues taking atorvastatin.  She developed muscle spasms in her right after back after caring for a family member who is dying of metastatic breast cancer.  She has been seen at [facility] for this and is undergone dry needling and massage therapy.  The spasms persist.  History reviewed. No pertinent past medical history.  Past Surgical History:  Procedure Laterality Date  . ABDOMINAL HYSTERECTOMY    . CESAREAN SECTION      Family History  Problem Relation Age of Onset  . COPD Mother   . Heart disease Mother     Social History   Socioeconomic History  . Marital status: Married    Spouse name: Not on file  . Number of children: Not on file  . Years of education: Not on file  . Highest education level: Not on file  Occupational History  . Not on file  Social Needs  . Financial resource strain: Not on file  . Food insecurity:    Worry: Not on file    Inability: Not on file  . Transportation needs:    Medical: Not on file    Non-medical: Not on file  Tobacco Use  . Smoking status: Never Smoker  . Smokeless tobacco: Never Used  Substance and Sexual Activity  . Alcohol use: Yes    Comment: ocass.  . Drug use: No  . Sexual activity: Not on file  Lifestyle  . Physical activity:    Days per week: Not on file    Minutes per session: Not on file  . Stress: Not on file  Relationships  . Social connections:    Talks on phone: Not on file    Gets together: Not on file    Attends  religious service: Not on file    Active member of club or organization: Not on file    Attends meetings of clubs or organizations: Not on file    Relationship status: Not on file  . Intimate partner violence:    Fear of current or ex partner: Not on file    Emotionally abused: Not on file    Physically abused: Not on file    Forced sexual activity: Not on file  Other Topics Concern  . Not on file  Social History Narrative  . Not on file    Outpatient Medications Prior to Visit  Medication Sig Dispense Refill  . estradiol (ESTRACE) 0.5 MG tablet TAKE ONE TABLET BY MOUTH TWICE A DAY 180 tablet 0  . atorvastatin (LIPITOR) 20 MG tablet TAKE ONE TABLET BY MOUTH DAILY 90 tablet 1  . levothyroxine (SYNTHROID, LEVOTHROID) 75 MCG tablet TAKE ONE TABLET BY MOUTH EVERY MORNING ON AN EMPTY STOMACH 1 HOUR BEFORE EATING 90 tablet 1  . Vitamin D, Ergocalciferol, (DRISDOL) 1.25 MG (50000 UT) CAPS capsule TAKE ONE CAPSULE BY MOUTH EVERY 7 DAYS 12 capsule 0   No facility-administered medications prior to visit.     Allergies  Allergen Reactions  . Levaquin [Levofloxacin] Rash  ROS Review of Systems  Constitutional: Negative for chills, diaphoresis, fatigue, fever and unexpected weight change.  HENT: Negative.   Eyes: Negative for photophobia and visual disturbance.  Respiratory: Negative.   Cardiovascular: Negative.   Gastrointestinal: Negative.   Endocrine: Negative for polyphagia and polyuria.  Genitourinary: Negative.   Musculoskeletal: Positive for myalgias. Negative for neck pain.  Neurological: Negative for weakness and numbness.  Hematological: Negative.   Psychiatric/Behavioral: Negative.       Objective:    Physical Exam  Constitutional: She is oriented to person, place, and time. She appears well-developed and well-nourished. No distress.  HENT:  Head: Normocephalic and atraumatic.  Right Ear: External ear normal.  Left Ear: External ear normal.  Mouth/Throat:  Oropharynx is clear and moist. No oropharyngeal exudate.  Eyes: Pupils are equal, round, and reactive to light. Conjunctivae are normal. Right eye exhibits no discharge. Left eye exhibits no discharge. No scleral icterus.  Neck: Neck supple. No JVD present. No tracheal deviation present. No thyromegaly present.  Cardiovascular: Normal rate, regular rhythm and normal heart sounds.  Pulmonary/Chest: Effort normal and breath sounds normal. No stridor.  Musculoskeletal:     Cervical back: She exhibits normal range of motion, no tenderness and no bony tenderness.  Lymphadenopathy:    She has no cervical adenopathy.  Neurological: She is alert and oriented to person, place, and time. She has normal strength.  Neg spurling   Skin: Skin is warm and dry. She is not diaphoretic.  Psychiatric: She has a normal mood and affect. Her behavior is normal.    BP 136/80   Pulse 66   Ht 5' 4.5" (1.638 m)   Wt 178 lb 6 oz (80.9 kg)   SpO2 97%   BMI 30.15 kg/m  Wt Readings from Last 3 Encounters:  05/20/18 178 lb 6 oz (80.9 kg)  10/21/17 175 lb 8 oz (79.6 kg)  06/23/17 175 lb 4 oz (79.5 kg)   BP Readings from Last 3 Encounters:  05/20/18 136/80  10/21/17 120/70  06/23/17 120/72   Guideline developer:  UpToDate (see UpToDate for funding source) Date Released: June 2014  Health Maintenance Due  Topic Date Due  . HIV Screening  01/05/1970  . TETANUS/TDAP  01/05/1974    There are no preventive care reminders to display for this patient.  Lab Results  Component Value Date   TSH 1.57 05/20/2018   Lab Results  Component Value Date   WBC 5.4 05/20/2018   HGB 13.8 05/20/2018   HCT 40.1 05/20/2018   MCV 91.3 05/20/2018   PLT 229.0 05/20/2018   Lab Results  Component Value Date   NA 140 05/20/2018   K 4.0 05/20/2018   CO2 27 05/20/2018   GLUCOSE 102 (H) 05/20/2018   BUN 18 05/20/2018   CREATININE 0.85 05/20/2018   BILITOT 0.7 05/20/2018   ALKPHOS 84 05/20/2018   AST 19 05/20/2018    ALT 19 05/20/2018   PROT 7.2 05/20/2018   ALBUMIN 4.6 05/20/2018   CALCIUM 9.4 05/20/2018   GFR 67.47 05/20/2018   Lab Results  Component Value Date   CHOL 179 05/20/2018   Lab Results  Component Value Date   HDL 69.10 05/20/2018   Lab Results  Component Value Date   LDLCALC 88 05/20/2018   Lab Results  Component Value Date   TRIG 105.0 05/20/2018   Lab Results  Component Value Date   CHOLHDL 3 05/20/2018   No results found for: HGBA1C    Assessment & Plan:  Problem List Items Addressed This Visit      Endocrine   Acquired hypothyroidism - Primary   Relevant Medications   levothyroxine (SYNTHROID, LEVOTHROID) 75 MCG tablet   Other Relevant Orders   TSH (Completed)     Musculoskeletal and Integument   Osteopenia     Other   Elevated LDL cholesterol level   Relevant Medications   atorvastatin (LIPITOR) 20 MG tablet   Other Relevant Orders   CBC (Completed)   Comprehensive metabolic panel (Completed)   Lipid panel (Completed)   Vitamin D deficiency   Relevant Medications   Vitamin D, Ergocalciferol, (DRISDOL) 1.25 MG (50000 UT) CAPS capsule   Other Relevant Orders   VITAMIN D 25 Hydroxy (Vit-D Deficiency, Fractures) (Completed)    Other Visit Diagnoses    Trapezius strain, right, initial encounter       Relevant Medications   methocarbamol (ROBAXIN) 500 MG tablet   Healthcare maintenance       Relevant Orders   CBC (Completed)   Urinalysis, Routine w reflex microscopic (Completed)      Meds ordered this encounter  Medications  . atorvastatin (LIPITOR) 20 MG tablet    Sig: Take 1 tablet (20 mg total) by mouth daily.    Dispense:  90 tablet    Refill:  1  . DISCONTD: levothyroxine (SYNTHROID, LEVOTHROID) 75 MCG tablet    Sig: Take each morning on a fasting stomach 1 hour before eating.    Dispense:  90 tablet    Refill:  4  . Vitamin D, Ergocalciferol, (DRISDOL) 1.25 MG (50000 UT) CAPS capsule    Sig: TAKE ONE CAPSULE BY MOUTH EVERY 7 DAYS     Dispense:  12 capsule    Refill:  0  . methocarbamol (ROBAXIN) 500 MG tablet    Sig: Take 1 tablet (500 mg total) by mouth every 8 (eight) hours as needed for muscle spasms.    Dispense:  60 tablet    Refill:  0  . levothyroxine (SYNTHROID, LEVOTHROID) 75 MCG tablet    Sig: Take each morning on a fasting stomach 1 hour before eating.    Dispense:  90 tablet    Refill:  4    Follow-up: Return in about 6 months (around 11/20/2018), or if symptoms worsen or fail to improve.    Continue follow-up with integr[facility] for trapezius rhomboid strain and start Robaxin every 8 for 7 days and then as needed.  Continue therapies as above.  Follow-up in 6 months or sooner as needed.

## 2018-05-21 MED ORDER — LEVOTHYROXINE SODIUM 75 MCG PO TABS: ORAL_TABLET | ORAL | 4 refills | Status: DC

## 2018-05-21 NOTE — Addendum Note (Signed)
Addended by: Andrez Grime on: 05/21/2018 03:38 PM   Modules accepted: Orders

## 2018-10-03 ENCOUNTER — Other Ambulatory Visit: Payer: Self-pay | Admitting: Family Medicine

## 2018-10-03 DIAGNOSIS — E559 Vitamin D deficiency, unspecified: Secondary | ICD-10-CM

## 2018-11-24 ENCOUNTER — Ambulatory Visit: Payer: BC Managed Care – PPO | Admitting: Family Medicine

## 2019-02-28 ENCOUNTER — Other Ambulatory Visit: Payer: Self-pay | Admitting: Family Medicine

## 2019-02-28 DIAGNOSIS — E7800 Pure hypercholesterolemia, unspecified: Secondary | ICD-10-CM

## 2019-05-10 ENCOUNTER — Other Ambulatory Visit: Payer: Self-pay

## 2019-05-11 ENCOUNTER — Encounter: Payer: Self-pay | Admitting: Family Medicine

## 2019-05-11 ENCOUNTER — Ambulatory Visit (INDEPENDENT_AMBULATORY_CARE_PROVIDER_SITE_OTHER): Payer: BC Managed Care – PPO | Admitting: Family Medicine

## 2019-05-11 VITALS — BP 140/86 | HR 72 | Temp 97.1°F | Ht 64.25 in | Wt 177.6 lb

## 2019-05-11 DIAGNOSIS — Z0000 Encounter for general adult medical examination without abnormal findings: Secondary | ICD-10-CM | POA: Diagnosis not present

## 2019-05-11 DIAGNOSIS — M545 Low back pain: Secondary | ICD-10-CM | POA: Diagnosis not present

## 2019-05-11 DIAGNOSIS — E7800 Pure hypercholesterolemia, unspecified: Secondary | ICD-10-CM | POA: Diagnosis not present

## 2019-05-11 DIAGNOSIS — E559 Vitamin D deficiency, unspecified: Secondary | ICD-10-CM

## 2019-05-11 DIAGNOSIS — E039 Hypothyroidism, unspecified: Secondary | ICD-10-CM

## 2019-05-11 DIAGNOSIS — H938X1 Other specified disorders of right ear: Secondary | ICD-10-CM | POA: Insufficient documentation

## 2019-05-11 DIAGNOSIS — G8929 Other chronic pain: Secondary | ICD-10-CM | POA: Insufficient documentation

## 2019-05-11 DIAGNOSIS — S39011A Strain of muscle, fascia and tendon of abdomen, initial encounter: Secondary | ICD-10-CM | POA: Insufficient documentation

## 2019-05-11 MED ORDER — FLUTICASONE PROPIONATE 50 MCG/ACT NA SUSP: 2.0000 | Freq: Every day | NASAL | 6 refills | Status: DC

## 2019-05-11 NOTE — Progress Notes (Signed)
Established Patient Office Visit  Subjective:  Patient ID: Brianna Luna, female    DOB: 1955-07-24  Age: 64 y.o. MRN: [id number]  CC:  Chief Complaint  Patient presents with  . Annual Exam    Pt here for physical, fasting for labs.  Pt would like to discuss a fall she had Friday and hurt her back.   Pt also c/o nerve pain in rt leg and having cold chills at night.  She is due for Tdap but plans on getting the Covid Vaccine first.    HPI Brianna Luna presents for a complete physical and a discussion of her chronic medical issues.  She fell 6 days ago and landed on her left flank.  She remains a little sore in the left posterior chest area.  She has been taking ibuprofen with some benefit.  She is not seeing any hematuria.  She has been having pain in her lower back with associated pain in her right lateral and medial leg.  History of discectomy in 2009.  Continues taking her Synthroid as directed in the morning an hour before eating.  She continues with her atorvastatin without issue.  Has had dental care this past year.  Seeing GYN for women's care.  Has been experiencing right ear congestion without pain or discharge.  History reviewed. No pertinent past medical history.  Past Surgical History:  Procedure Laterality Date  . ABDOMINAL HYSTERECTOMY    . CESAREAN SECTION      Family History  Problem Relation Age of Onset  . COPD Mother   . Heart disease Mother     Social History   Socioeconomic History  . Marital status: Married    Spouse name: Not on file  . Number of children: Not on file  . Years of education: Not on file  . Highest education level: Not on file  Occupational History  . Not on file  Tobacco Use  . Smoking status: Never Smoker  . Smokeless tobacco: Never Used  Substance and Sexual Activity  . Alcohol use: Yes    Comment: ocass.  . Drug use: No  . Sexual activity: Not on file  Other Topics Concern  . Not on file  Social History Narrative  .  Not on file   Social Determinants of Health   Financial Resource Strain:   . Difficulty of Paying Living Expenses: Not on file  Food Insecurity:   . Worried About Programme researcher, broadcasting/film/video in the Last Year: Not on file  . Ran Out of Food in the Last Year: Not on file  Transportation Needs:   . Lack of Transportation (Medical): Not on file  . Lack of Transportation (Non-Medical): Not on file  Physical Activity:   . Days of Exercise per Week: Not on file  . Minutes of Exercise per Session: Not on file  Stress:   . Feeling of Stress : Not on file  Social Connections:   . Frequency of Communication with Friends and Family: Not on file  . Frequency of Social Gatherings with Friends and Family: Not on file  . Attends Religious Services: Not on file  . Active Member of Clubs or Organizations: Not on file  . Attends Banker Meetings: Not on file  . Marital Status: Not on file  Intimate Partner Violence:   . Fear of Current or Ex-Partner: Not on file  . Emotionally Abused: Not on file  . Physically Abused: Not on file  . Sexually Abused: Not  on file    Outpatient Medications Prior to Visit  Medication Sig Dispense Refill  . atorvastatin (LIPITOR) 20 MG tablet TAKE ONE TABLET BY MOUTH DAILY 90 tablet 0  . levothyroxine (SYNTHROID, LEVOTHROID) 75 MCG tablet Take each morning on a fasting stomach 1 hour before eating. 90 tablet 4  . methocarbamol (ROBAXIN) 500 MG tablet Take 1 tablet (500 mg total) by mouth every 8 (eight) hours as needed for muscle spasms. 60 tablet 0  . estradiol (ESTRACE) 0.5 MG tablet TAKE ONE TABLET BY MOUTH TWICE A DAY (Patient not taking: Reported on 05/11/2019) 180 tablet 0  . Vitamin D, Ergocalciferol, (DRISDOL) 1.25 MG (50000 UT) CAPS capsule TAKE ONE CAPSULE BY MOUTH EVERY 7 DAYS 4 capsule 0   No facility-administered medications prior to visit.    Allergies  Allergen Reactions  . Levaquin [Levofloxacin] Rash    ROS Review of Systems    Constitutional: Negative.   HENT: Positive for hearing loss. Negative for ear discharge and ear pain.   Eyes: Negative for photophobia and visual disturbance.  Respiratory: Negative.   Cardiovascular: Negative.   Gastrointestinal: Negative.   Genitourinary: Negative.  Negative for hematuria.  Musculoskeletal: Positive for back pain.  Skin: Negative for pallor and rash.  Neurological: Positive for numbness.  Hematological: Does not bruise/bleed easily.  Psychiatric/Behavioral: Negative.       Objective:    Physical Exam  Constitutional: She is oriented to person, place, and time. She appears well-developed and well-nourished. No distress.  HENT:  Head: Normocephalic and atraumatic.  Right Ear: External ear normal. Tympanic membrane is retracted. Tympanic membrane is not injected, not scarred, not perforated and not erythematous.  Left Ear: External ear normal.  Mouth/Throat: Oropharynx is clear and moist. No oropharyngeal exudate.  Eyes: Conjunctivae are normal. Right eye exhibits no discharge. Left eye exhibits no discharge. No scleral icterus.  Neck: No JVD present. No tracheal deviation present. No thyromegaly present.  Cardiovascular: Normal rate, regular rhythm and normal heart sounds.  Pulmonary/Chest: Effort normal and breath sounds normal. No stridor.  Abdominal: Bowel sounds are normal.  Musculoskeletal:     Lumbar back: No tenderness or bony tenderness. Normal range of motion.       Back:  Lymphadenopathy:    She has no cervical adenopathy.  Neurological: She is alert and oriented to person, place, and time.  Skin: Skin is warm and dry. She is not diaphoretic.  Psychiatric: She has a normal mood and affect. Her behavior is normal.    BP 140/86 (BP Location: Left Arm, Patient Position: Sitting, Cuff Size: Normal)   Pulse 72   Temp (!) 97.1 F (36.2 C)   Ht 5' 4.25" (1.632 m)   Wt 177 lb 9.6 oz (80.6 kg)   BMI 30.25 kg/m  Wt Readings from Last 3 Encounters:   05/11/19 177 lb 9.6 oz (80.6 kg)  05/20/18 178 lb 6 oz (80.9 kg)  10/21/17 175 lb 8 oz (79.6 kg)     Health Maintenance Due  Topic Date Due  . HIV Screening  01/05/1970  . TETANUS/TDAP  01/05/1974  . INFLUENZA VACCINE  10/03/2018    There are no preventive care reminders to display for this patient.  Lab Results  Component Value Date   TSH 1.57 05/20/2018   Lab Results  Component Value Date   WBC 5.4 05/20/2018   HGB 13.8 05/20/2018   HCT 40.1 05/20/2018   MCV 91.3 05/20/2018   PLT 229.0 05/20/2018   Lab Results  Component Value Date   NA 140 05/20/2018   K 4.0 05/20/2018   CO2 27 05/20/2018   GLUCOSE 102 (H) 05/20/2018   BUN 18 05/20/2018   CREATININE 0.85 05/20/2018   BILITOT 0.7 05/20/2018   ALKPHOS 84 05/20/2018   AST 19 05/20/2018   ALT 19 05/20/2018   PROT 7.2 05/20/2018   ALBUMIN 4.6 05/20/2018   CALCIUM 9.4 05/20/2018   GFR 67.47 05/20/2018   Lab Results  Component Value Date   CHOL 179 05/20/2018   Lab Results  Component Value Date   HDL 69.10 05/20/2018   Lab Results  Component Value Date   LDLCALC 88 05/20/2018   Lab Results  Component Value Date   TRIG 105.0 05/20/2018   Lab Results  Component Value Date   CHOLHDL 3 05/20/2018   No results found for: HGBA1C    Assessment & Plan:   Problem List Items Addressed This Visit      Endocrine   Acquired hypothyroidism   Relevant Orders   TSH     Musculoskeletal and Integument   Strain of flank     Other   Elevated LDL cholesterol level   Relevant Orders   LDL cholesterol, direct   Lipid panel   Vitamin D deficiency   Relevant Orders   VITAMIN D 25 Hydroxy (Vit-D Deficiency, Fractures)   Healthcare maintenance - Primary   Relevant Orders   CBC   Comprehensive metabolic panel   Urinalysis, Routine w reflex microscopic   Chronic right-sided low back pain   Relevant Orders   Ambulatory referral to Sports Medicine   Congestion of right ear   Relevant Medications    fluticasone (FLONASE) 50 MCG/ACT nasal spray      Meds ordered this encounter  Medications  . fluticasone (FLONASE) 50 MCG/ACT nasal spray    Sig: Place 2 sprays into both nostrils daily.    Dispense:  16 g    Refill:  6    Follow-up: Return in about 6 months (around 11/11/2019), or if symptoms worsen or fail to improve.   Patient was given information on health maintenance and disease prevention is where well is eustachian tube dysfunction.  Agrees to see sports medicine for follow-up of her lower back pain.  Will apply heat and use ibuprofen as needed for her left flank pain.  Follow-up in a few weeks if not proving. Mliss Sax, MD

## 2019-05-11 NOTE — Patient Instructions (Addendum)
Health Maintenance Due  Topic Date Due  . HIV Screening  01/05/1970  . TETANUS/TDAP  01/05/1974  . INFLUENZA VACCINE  10/03/2018    Depression screen PHQ 2/9 05/13/2017  Decreased Interest 0  Down, Depressed, Hopeless 0  PHQ - 2 Score 0    Health Maintenance, Female Adopting a healthy lifestyle and getting preventive care are important in promoting health and wellness. Ask your health care provider about:  The right schedule for you to have regular tests and exams.  Things you can do on your own to prevent diseases and keep yourself healthy. What should I know about diet, weight, and exercise? Eat a healthy diet   Eat a diet that includes plenty of vegetables, fruits, low-fat dairy products, and lean protein.  Do not eat a lot of foods that are high in solid fats, added sugars, or sodium. Maintain a healthy weight Body mass index (BMI) is used to identify weight problems. It estimates body fat based on height and weight. Your health care provider can help determine your BMI and help you achieve or maintain a healthy weight. Get regular exercise Get regular exercise. This is one of the most important things you can do for your health. Most adults should:  Exercise for at least 150 minutes each week. The exercise should increase your heart rate and make you sweat (moderate-intensity exercise).  Do strengthening exercises at least twice a week. This is in addition to the moderate-intensity exercise.  Spend less time sitting. Even light physical activity can be beneficial. Watch cholesterol and blood lipids Have your blood tested for lipids and cholesterol at 65 years of age, then have this test every 5 years. Have your cholesterol levels checked more often if:  Your lipid or cholesterol levels are high.  You are older than 65 years of age.  You are at high risk for heart disease. What should I know about cancer screening? Depending on your health history and family history,  you may need to have cancer screening at various ages. This may include screening for:  Breast cancer.  Cervical cancer.  Colorectal cancer.  Skin cancer.  Lung cancer. What should I know about heart disease, diabetes, and high blood pressure? Blood pressure and heart disease  High blood pressure causes heart disease and increases the risk of stroke. This is more likely to develop in people who have high blood pressure readings, are of African descent, or are overweight.  Have your blood pressure checked: ? Every 3-5 years if you are 65-65 years of age. ? Every year if you are 65 years old or older. Diabetes Have regular diabetes screenings. This checks your fasting blood sugar level. Have the screening done:  Once every three years after age 65 if you are at a normal weight and have a low risk for diabetes.  More often and at a younger age if you are overweight or have a high risk for diabetes. What should I know about preventing infection? Hepatitis B If you have a higher risk for hepatitis B, you should be screened for this virus. Talk with your health care provider to find out if you are at risk for hepatitis B infection. Hepatitis C Testing is recommended for:  Everyone born from [birth year] through 1966.  Anyone with known risk factors for hepatitis C. Sexually transmitted infections (STIs)  Get screened for STIs, including gonorrhea and chlamydia, if: ? You are sexually active and are younger than 65 years of age. ? You are  older than 65 years of age and your health care provider tells you that you are at risk for this type of infection. ? Your sexual activity has changed since you were last screened, and you are at increased risk for chlamydia or gonorrhea. Ask your health care provider if you are at risk.  Ask your health care provider about whether you are at high risk for HIV. Your health care provider may recommend a prescription medicine to help prevent HIV infection.  If you choose to take medicine to prevent HIV, you should first get tested for HIV. You should then be tested every 3 months for as long as you are taking the medicine. Pregnancy  If you are about to stop having your period (premenopausal) and you may become pregnant, seek counseling before you get pregnant.  Take 400 to 800 micrograms (mcg) of folic acid every day if you become pregnant.  Ask for birth control (contraception) if you want to prevent pregnancy. Osteoporosis and menopause Osteoporosis is a disease in which the bones lose minerals and strength with aging. This can result in bone fractures. If you are 65 years old or older, or if you are at risk for osteoporosis and fractures, ask your health care provider if you should:  Be screened for bone loss.  Take a calcium or vitamin D supplement to lower your risk of fractures.  Be given hormone replacement therapy (HRT) to treat symptoms of menopause. Follow these instructions at home: Lifestyle  Do not use any products that contain nicotine or tobacco, such as cigarettes, e-cigarettes, and chewing tobacco. If you need help quitting, ask your health care provider.  Do not use street drugs.  Do not share needles.  Ask your health care provider for help if you need support or information about quitting drugs. Alcohol use  Do not drink alcohol if: ? Your health care provider tells you not to drink. ? You are pregnant, may be pregnant, or are planning to become pregnant.  If you drink alcohol: ? Limit how much you use to 0-1 drink a day. ? Limit intake if you are breastfeeding.  Be aware of how much alcohol is in your drink. In the [place] one drink equals one 12 oz bottle of beer (355 mL), one 5 oz glass of wine (148 mL), or one 1 oz glass of hard liquor (44 mL). General instructions  Schedule regular health, dental, and eye exams.  Stay current with your vaccines.  Tell your health care provider if: ? You often feel  depressed. ? You have ever been abused or do not feel safe at home. Summary  Adopting a healthy lifestyle and getting preventive care are important in promoting health and wellness.  Follow your health care provider's instructions about healthy diet, exercising, and getting tested or screened for diseases.  Follow your health care provider's instructions on monitoring your cholesterol and blood pressure. This information is not intended to replace advice given to you by your health care provider. Make sure you discuss any questions you have with your health care provider. Document Revised: 02/11/2018 Document Reviewed: 02/11/2018 Elsevier Patient Education  2020 [employer].  Preventive Care 63-27 Years Old, Female Preventive care refers to visits with your health care provider and lifestyle choices that can promote health and wellness. This includes:  A yearly physical exam. This may also be called an annual well check.  Regular dental visits and eye exams.  Immunizations.  Screening for certain conditions.  Healthy lifestyle  choices, such as eating a healthy diet, getting regular exercise, not using drugs or products that contain nicotine and tobacco, and limiting alcohol use. What can I expect for my preventive care visit? Physical exam Your health care provider will check your:  Height and weight. This may be used to calculate body mass index (BMI), which tells if you are at a healthy weight.  Heart rate and blood pressure.  Skin for abnormal spots. Counseling Your health care provider may ask you questions about your:  Alcohol, tobacco, and drug use.  Emotional well-being.  Home and relationship well-being.  Sexual activity.  Eating habits.  Work and work Statistician.  Method of birth control.  Menstrual cycle.  Pregnancy history. What immunizations do I need?  Influenza (flu) vaccine  This is recommended every year. Tetanus, diphtheria, and pertussis  (Tdap) vaccine  You may need a Td booster every 10 years. Varicella (chickenpox) vaccine  You may need this if you have not been vaccinated. Zoster (shingles) vaccine  You may need this after age 34. Measles, mumps, and rubella (MMR) vaccine  You may need at least one dose of MMR if you were born in 1957 or later. You may also need a second dose. Pneumococcal conjugate (PCV13) vaccine  You may need this if you have certain conditions and were not previously vaccinated. Pneumococcal polysaccharide (PPSV23) vaccine  You may need one or two doses if you smoke cigarettes or if you have certain conditions. Meningococcal conjugate (MenACWY) vaccine  You may need this if you have certain conditions. Hepatitis A vaccine  You may need this if you have certain conditions or if you travel or work in places where you may be exposed to hepatitis A. Hepatitis B vaccine  You may need this if you have certain conditions or if you travel or work in places where you may be exposed to hepatitis B. Haemophilus influenzae type b (Hib) vaccine  You may need this if you have certain conditions. Human papillomavirus (HPV) vaccine  If recommended by your health care provider, you may need three doses over 6 months. You may receive vaccines as individual doses or as more than one vaccine together in one shot (combination vaccines). Talk with your health care provider about the risks and benefits of combination vaccines. What tests do I need? Blood tests  Lipid and cholesterol levels. These may be checked every 5 years, or more frequently if you are over 7 years old.  Hepatitis C test.  Hepatitis B test. Screening  Lung cancer screening. You may have this screening every year starting at age 44 if you have a 30-pack-year history of smoking and currently smoke or have quit within the past 15 years.  Colorectal cancer screening. All adults should have this screening starting at age 76 and  continuing until age 65. Your health care provider may recommend screening at age 77 if you are at increased risk. You will have tests every 1-10 years, depending on your results and the type of screening test.  Diabetes screening. This is done by checking your blood sugar (glucose) after you have not eaten for a while (fasting). You may have this done every 1-3 years.  Mammogram. This may be done every 1-2 years. Talk with your health care provider about when you should start having regular mammograms. This may depend on whether you have a family history of breast cancer.  BRCA-related cancer screening. This may be done if you have a family history of breast, ovarian,  tubal, or peritoneal cancers.  Pelvic exam and Pap test. This may be done every 3 years starting at age 60. Starting at age 58, this may be done every 5 years if you have a Pap test in combination with an HPV test. Other tests  Sexually transmitted disease (STD) testing.  Bone density scan. This is done to screen for osteoporosis. You may have this scan if you are at high risk for osteoporosis. Follow these instructions at home: Eating and drinking  Eat a diet that includes fresh fruits and vegetables, whole grains, lean protein, and low-fat dairy.  Take vitamin and mineral supplements as recommended by your health care provider.  Do not drink alcohol if: ? Your health care provider tells you not to drink. ? You are pregnant, may be pregnant, or are planning to become pregnant.  If you drink alcohol: ? Limit how much you have to 0-1 drink a day. ? Be aware of how much alcohol is in your drink. In the [place] one drink equals one 12 oz bottle of beer (355 mL), one 5 oz glass of wine (148 mL), or one 1 oz glass of hard liquor (44 mL). Lifestyle  Take daily care of your teeth and gums.  Stay active. Exercise for at least 30 minutes on 5 or more days each week.  Do not use any products that contain nicotine or tobacco,  such as cigarettes, e-cigarettes, and chewing tobacco. If you need help quitting, ask your health care provider.  If you are sexually active, practice safe sex. Use a condom or other form of birth control (contraception) in order to prevent pregnancy and STIs (sexually transmitted infections).  If told by your health care provider, take low-dose aspirin daily starting at age 37. What's next?  Visit your health care provider once a year for a well check visit.  Ask your health care provider how often you should have your eyes and teeth checked.  Stay up to date on all vaccines. This information is not intended to replace advice given to you by your health care provider. Make sure you discuss any questions you have with your health care provider. Document Revised: 10/30/2017 Document Reviewed: 10/30/2017 Elsevier Patient Education  [place].  Eustachian Tube Dysfunction  Eustachian tube dysfunction refers to a condition in which a blockage develops in the narrow passage that connects the middle ear to the back of the nose (eustachian tube). The eustachian tube regulates air pressure in the middle ear by letting air move between the ear and nose. It also helps to drain fluid from the middle ear space. Eustachian tube dysfunction can affect one or both ears. When the eustachian tube does not function properly, air pressure, fluid, or both can build up in the middle ear. What are the causes? This condition occurs when the eustachian tube becomes blocked or cannot open normally. Common causes of this condition include:  Ear infections.  Colds and other infections that affect the nose, mouth, and throat (upper respiratory tract).  Allergies.  Irritation from cigarette smoke.  Irritation from stomach acid coming up into the esophagus (gastroesophageal reflux). The esophagus is the tube that carries food from the mouth to the stomach.  Sudden changes in air pressure, such as from  descending in an airplane or scuba diving.  Abnormal growths in the nose or throat, such as: ? Growths that line the nose (nasal polyps). ? Abnormal growth of cells (tumors). ? Enlarged tissue at the back of the  throat (adenoids). What increases the risk? You are more likely to develop this condition if:  You smoke.  You are overweight.  You are a child who has: ? Certain birth defects of the mouth, such as cleft palate. ? Large tonsils or adenoids. What are the signs or symptoms? Common symptoms of this condition include:  A feeling of fullness in the ear.  Ear pain.  Clicking or popping noises in the ear.  Ringing in the ear.  Hearing loss.  Loss of balance.  Dizziness. Symptoms may get worse when the air pressure around you changes, such as when you travel to an area of high elevation, fly on an airplane, or go scuba diving. How is this diagnosed? This condition may be diagnosed based on:  Your symptoms.  A physical exam of your ears, nose, and throat.  Tests, such as those that measure: ? The movement of your eardrum (tympanogram). ? Your hearing (audiometry). How is this treated? Treatment depends on the cause and severity of your condition.  In mild cases, you may relieve your symptoms by moving air into your ears. This is called "popping the ears."  In more severe cases, or if you have symptoms of fluid in your ears, treatment may include: ? Medicines to relieve congestion (decongestants). ? Medicines that treat allergies (antihistamines). ? Nasal sprays or ear drops that contain medicines that reduce swelling (steroids). ? A procedure to drain the fluid in your eardrum (myringotomy). In this procedure, a small tube is placed in the eardrum to:  Drain the fluid.  Restore the air in the middle ear space. ? A procedure to insert a balloon device through the nose to inflate the opening of the eustachian tube (balloon dilation). Follow these instructions  at home: Lifestyle  Do not do any of the following until your health care provider approves: ? Travel to high altitudes. ? Fly in airplanes. ? Work in a Pension scheme manager or room. ? Scuba dive.  Do not use any products that contain nicotine or tobacco, such as cigarettes and e-cigarettes. If you need help quitting, ask your health care provider.  Keep your ears dry. Wear fitted earplugs during showering and bathing. Dry your ears completely after. General instructions  Take over-the-counter and prescription medicines only as told by your health care provider.  Use techniques to help pop your ears as recommended by your health care provider. These may include: ? Chewing gum. ? Yawning. ? Frequent, forceful swallowing. ? Closing your mouth, holding your nose closed, and gently blowing as if you are trying to blow air out of your nose.  Keep all follow-up visits as told by your health care provider. This is important. Contact a health care provider if:  Your symptoms do not go away after treatment.  Your symptoms come back after treatment.  You are unable to pop your ears.  You have: ? A fever. ? Pain in your ear. ? Pain in your head or neck. ? Fluid draining from your ear.  Your hearing suddenly changes.  You become very dizzy.  You lose your balance. Summary  Eustachian tube dysfunction refers to a condition in which a blockage develops in the eustachian tube.  It can be caused by ear infections, allergies, inhaled irritants, or abnormal growths in the nose or throat.  Symptoms include ear pain, hearing loss, or ringing in the ears.  Mild cases are treated with maneuvers to unblock the ears, such as yawning or ear popping.  Severe cases  are treated with medicines. Surgery may also be done (rare). This information is not intended to replace advice given to you by your health care provider. Make sure you discuss any questions you have with your health care  provider. Document Revised: 06/10/2017 Document Reviewed: 06/10/2017 Elsevier Patient Education  [place].

## 2019-05-12 LAB — CBC
HCT: 39.6 % (ref 36.0–46.0)
Hemoglobin: 13.7 g/dL (ref 12.0–15.0)
MCHC: 34.6 g/dL (ref 30.0–36.0)
MCV: 91.3 fl (ref 78.0–100.0)
Platelets: 205 10*3/uL (ref 150.0–400.0)
RBC: 4.34 Mil/uL (ref 3.87–5.11)
RDW: 12.7 % (ref 11.5–15.5)
WBC: 6.2 10*3/uL (ref 4.0–10.5)

## 2019-05-12 LAB — COMPREHENSIVE METABOLIC PANEL
ALT: 26 U/L (ref 0–35)
AST: 25 U/L (ref 0–37)
Albumin: 4.4 g/dL (ref 3.5–5.2)
Alkaline Phosphatase: 89 U/L (ref 39–117)
BUN: 14 mg/dL (ref 6–23)
CO2: 30 mEq/L (ref 19–32)
Calcium: 9.7 mg/dL (ref 8.4–10.5)
Chloride: 105 mEq/L (ref 96–112)
Creatinine, Ser: 0.85 mg/dL (ref 0.40–1.20)
GFR: 67.26 mL/min (ref >60.00)
Glucose, Bld: 92 mg/dL (ref 70–99)
Potassium: 4.8 mEq/L (ref 3.5–5.1)
Sodium: 139 mEq/L (ref 135–145)
Total Bilirubin: 0.9 mg/dL (ref 0.2–1.2)
Total Protein: 7 g/dL (ref 6.0–8.3)

## 2019-05-12 LAB — LIPID PANEL
Cholesterol: 178 mg/dL (ref 0–200)
HDL: 61.5 mg/dL (ref >39.00)
LDL Cholesterol: 95 mg/dL (ref 0–99)
NonHDL: 116.19
Total CHOL/HDL Ratio: 3
Triglycerides: 105 mg/dL (ref 0.0–149.0)
VLDL: 21 mg/dL (ref 0.0–40.0)

## 2019-05-12 LAB — URINALYSIS, ROUTINE W REFLEX MICROSCOPIC
Bilirubin Urine: NEGATIVE
Hgb urine dipstick: NEGATIVE
Ketones, ur: NEGATIVE
Leukocytes,Ua: NEGATIVE
Nitrite: NEGATIVE
RBC / HPF: NONE SEEN (ref 0–?)
Specific Gravity, Urine: <=1.005 — AB (ref 1.000–1.030)
Total Protein, Urine: NEGATIVE
Urine Glucose: NEGATIVE
Urobilinogen, UA: 0.2 (ref 0.0–1.0)
pH: 6 (ref 5.0–8.0)

## 2019-05-12 LAB — LDL CHOLESTEROL, DIRECT: Direct LDL: 92 mg/dL

## 2019-05-12 LAB — TSH: TSH: 0.88 u[IU]/mL (ref 0.35–4.50)

## 2019-05-12 LAB — VITAMIN D 25 HYDROXY (VIT D DEFICIENCY, FRACTURES): VITD: 38.7 ng/mL (ref 30.00–100.00)

## 2019-05-22 ENCOUNTER — Other Ambulatory Visit: Payer: Self-pay | Admitting: Family Medicine

## 2019-05-22 DIAGNOSIS — E039 Hypothyroidism, unspecified: Secondary | ICD-10-CM

## 2019-05-26 ENCOUNTER — Telehealth: Payer: Self-pay | Admitting: Family Medicine

## 2019-05-26 NOTE — Telephone Encounter (Signed)
Patient is calling and wanted to speak to someone regarding a form that she left  that was suppose to be faxed back to her spouse employer. CB is [phone]

## 2019-05-27 ENCOUNTER — Ambulatory Visit (INDEPENDENT_AMBULATORY_CARE_PROVIDER_SITE_OTHER): Payer: BC Managed Care – PPO | Admitting: Family Medicine

## 2019-05-27 ENCOUNTER — Encounter: Payer: Self-pay | Admitting: Family Medicine

## 2019-05-27 ENCOUNTER — Other Ambulatory Visit: Payer: Self-pay

## 2019-05-27 ENCOUNTER — Ambulatory Visit (INDEPENDENT_AMBULATORY_CARE_PROVIDER_SITE_OTHER): Payer: BC Managed Care – PPO

## 2019-05-27 VITALS — BP 118/84 | HR 60 | Ht 64.25 in | Wt 179.0 lb

## 2019-05-27 DIAGNOSIS — M5441 Lumbago with sciatica, right side: Secondary | ICD-10-CM | POA: Diagnosis not present

## 2019-05-27 DIAGNOSIS — M542 Cervicalgia: Secondary | ICD-10-CM | POA: Diagnosis not present

## 2019-05-27 DIAGNOSIS — G8929 Other chronic pain: Secondary | ICD-10-CM | POA: Diagnosis not present

## 2019-05-27 DIAGNOSIS — M545 Low back pain: Secondary | ICD-10-CM | POA: Diagnosis not present

## 2019-05-27 MED ORDER — GABAPENTIN 300 MG PO CAPS: 300.0000 mg | ORAL_CAPSULE | Freq: Three times a day (TID) | ORAL | 2 refills | Status: DC | PRN

## 2019-05-27 NOTE — Patient Instructions (Signed)
Thank you for coming in today. Plan for PT.  Try gabapentin up to 3x daily for nerve pain, but mostly take it just at night.  Plan for Xray today and MRI soon.  You should hear about MRI soon.  Phone number to [facility] is [phone].  We will authorize MRI prior to scheduling.   Recheck with me following MRI.    Radicular Pain Radicular pain is a type of pain that spreads from your back or neck along a spinal nerve. Spinal nerves are nerves that leave the spinal cord and go to the muscles. Radicular pain is sometimes called radiculopathy, radiculitis, or a pinched nerve. When you have this type of pain, you may also have weakness, numbness, or tingling in the area of your body that is supplied by the nerve. The pain may feel sharp and burning. Depending on which spinal nerve is affected, the pain may occur in the:  Neck area (cervical radicular pain). You may also feel pain, numbness, weakness, or tingling in the arms.  Mid-spine area (thoracic radicular pain). You would feel this pain in the back and chest. This type is rare.  Lower back area (lumbar radicular pain). You would feel this pain as low back pain. You may feel pain, numbness, weakness, or tingling in the buttocks or legs. Sciatica is a type of lumbar radicular pain that shoots down the back of the leg. Radicular pain occurs when one of the spinal nerves becomes irritated or squeezed (compressed). It is often caused by something pushing on a spinal nerve, such as one of the bones of the spine (vertebrae) or one of the round cushions between vertebrae (intervertebral disks). This can result from:  An injury.  Wear and tear or aging of a disk.  The growth of a bone spur that pushes on the nerve. Radicular pain often goes away when you follow instructions from your health care provider for relieving pain at home. Follow these instructions at home: Managing pain      If directed, put ice on the affected  area: ? Put ice in a plastic bag. ? Place a towel between your skin and the bag. ? Leave the ice on for 20 minutes, 2-3 times a day.  If directed, apply heat to the affected area as often as told by your health care provider. Use the heat source that your health care provider recommends, such as a moist heat pack or a heating pad. ? Place a towel between your skin and the heat source. ? Leave the heat on for 20-30 minutes. ? Remove the heat if your skin turns bright red. This is especially important if you are unable to feel pain, heat, or cold. You may have a greater risk of getting burned. Activity   Do not sit or rest in bed for long periods of time.  Try to stay as active as possible. Ask your health care provider what type of exercise or activity is best for you.  Avoid activities that make your pain worse, such as bending and lifting.  Do not lift anything that is heavier than 10 lb (4.5 kg), or the limit that you are told, until your health care provider says that it is safe.  Practice using proper technique when lifting items. Proper lifting technique involves bending your knees and rising up.  Do strength and range-of-motion exercises only as told by your health care provider or physical therapist. General instructions  Take over-the-counter and prescription medicines only  as told by your health care provider.  Pay attention to any changes in your symptoms.  Keep all follow-up visits as told by your health care provider. This is important. ? Your health care provider may send you to a physical therapist to help with this pain. Contact a health care provider if:  Your pain and other symptoms get worse.  Your pain medicine is not helping.  Your pain has not improved after a few weeks of home care.  You have a fever. Get help right away if:  You have severe pain, weakness, or numbness.  You have difficulty with bladder or bowel control. Summary  Radicular pain is  a type of pain that spreads from your back or neck along a spinal nerve.  When you have radicular pain, you may also have weakness, numbness, or tingling in the area of your body that is supplied by the nerve.  The pain may feel sharp or burning.  Radicular pain may be treated with ice, heat, medicines, or physical therapy. This information is not intended to replace advice given to you by your health care provider. Make sure you discuss any questions you have with your health care provider. Document Revised: 09/02/2017 Document Reviewed: 09/02/2017 Elsevier Patient Education  [place].

## 2019-05-27 NOTE — Progress Notes (Signed)
Subjective:    I'm seeing this patient as a consultation for:  Dr. Ethelene Hal. Note will be routed back to referring provider/PCP.  CC: Low back pain w/ R LE pain  I, Brianna Luna, LAT, ATC, am serving as scribe for Dr. Lynne Leader.  HPI: Pt is a 64 y/o female presenting w/ c/o chronic low back pain radiating to right leg.  Pain radiates to right lateral calf.  This is been ongoing for about 2 years.  She denies any significant change recently.  No bowel bladder dysfunction.  She has had some trials of a bit of chiropractic care previously with little benefit.  She has pertinent medical history of microdiscectomy at L3-4 and 2009 in [place].  She denies significant weakness.  Additionally she notes some chronic neck pain as well.  Symptoms are felt bilateral lateral neck to upper back.  No radiating pain weakness or numbness distally.  No injury.  Continues home exercise program in general for her back and core strengthening.    Past medical history, Surgical history, Family history, Social history, Allergies, and medications have been entered into the medical record, reviewed.   Review of Systems: No new headache, visual changes, nausea, vomiting, diarrhea, constipation, dizziness, abdominal pain, skin rash, fevers, chills, night sweats, weight loss, swollen lymph nodes, body aches, joint swelling, muscle aches, chest pain, shortness of breath, mood changes, visual or auditory hallucinations.   Objective:    Vitals:   05/27/19 1341  BP: 118/84  Pulse: 60  SpO2: 98%   General: Well Developed, well nourished, and in no acute distress.  Neuro/Psych: Alert and oriented x3, extra-ocular muscles intact, able to move all 4 extremities, sensation grossly intact. Skin: Warm and dry, no rashes noted.  Respiratory: Not using accessory muscles, speaking in full sentences, trachea midline.  Cardiovascular: Pulses palpable, no extremity edema. Abdomen: Does not appear distended. MSK:  C-spine:  Nontender to spinal midline.  Normal. Normal cervical motion. Upper extremity strength is intact.  Reflexes sensation intact bilateral upper extremities.  L-spine: Normal-appearing nontender spinal midline.  Normal lumbar motion. Lower extremity strength reflexes and sensation are equal normal throughout bilateral extremities. Normal gait.  Lab and Radiology Results  X-ray images C-spine and L-spine obtained today personally and independently reviewed.  C-spine: Spondylosis and mild degenerative changes throughout worse at C3-4  L-spine: DDD worse at L5-S1.  No acute fractures or severe degenerative changes otherwise.  Await formal radiology review  Impression and Recommendations:    Assessment and Plan: 64 y.o. female with low back pain with predominant symptom of right radiculopathy to the lateral calf consistent with L5.  Symptoms ongoing for 2 years now.  She does have history of prior microdiscectomy in 2009 at L3-4.  At this point she is effectively failing typical conservative management.  Plan for brief trial of gabapentin and physical therapy however we will proceed with MRI L-spine to further characterize pain in potentially plan for epidural steroid injections.  Recheck following MRI   Chronic neck pain: Fortunately without radicular symptoms.  X-ray pending today.  Plan for physical therapy.  Recheck in about a month or 2..  PDMP not reviewed this encounter. Orders Placed This Encounter  Procedures  . DG Lumbar Spine 2-3 Views    Standing Status:   Future    Number of Occurrences:   1    Standing Expiration Date:   07/26/2020    Order Specific Question:   Reason for Exam (SYMPTOM  OR DIAGNOSIS REQUIRED)  Answer:   eval back pain and Rt L5 rad    Order Specific Question:   Preferred imaging location?    Answer:   [facility]    Order Specific Question:   Radiology Contrast Protocol - do NOT remove file path    Answer:    \\[email or web address]\Radiant\DXFluoroContrastProtocols.pdf  . DG Cervical Spine 2 or 3 views    Standing Status:   Future    Number of Occurrences:   1    Standing Expiration Date:   07/26/2020    Order Specific Question:   Reason for Exam (SYMPTOM  OR DIAGNOSIS REQUIRED)    Answer:   eval neck pain    Order Specific Question:   Preferred imaging location?    Answer:   [facility]    Order Specific Question:   Radiology Contrast Protocol - do NOT remove file path    Answer:   \\[email or web address]\Radiant\DXFluoroContrastProtocols.pdf  . MR Lumbar Spine Wo Contrast    Standing Status:   Future    Standing Expiration Date:   07/26/2020    Order Specific Question:   ** REASON FOR EXAM (FREE TEXT)    Answer:   right L5?    Order Specific Question:   What is the patient's sedation requirement?    Answer:   No Sedation    Order Specific Question:   Does the patient have a pacemaker or implanted devices?    Answer:   No    Order Specific Question:   Preferred imaging location?    Answer:   [facility] (table limit-350lbs)    Order Specific Question:   Radiology Contrast Protocol - do NOT remove file path    Answer:   \\[email or web address]\Radiant\mriPROTOCOL.PDF  . Ambulatory referral to Physical Therapy    Referral Priority:   Routine    Referral Type:   Physical Medicine    Referral Reason:   Specialty Services Required    Requested Specialty:   Physical Therapy   Meds ordered this encounter  Medications  . gabapentin (NEURONTIN) 300 MG capsule    Sig: Take 1 capsule (300 mg total) by mouth 3 (three) times daily as needed (pain).    Dispense:  90 capsule    Refill:  2    Discussed warning signs or symptoms. Please see discharge instructions. Patient expresses understanding.   The above documentation has been reviewed and is accurate and complete Brianna Luna

## 2019-05-28 ENCOUNTER — Other Ambulatory Visit: Payer: Self-pay | Admitting: Family Medicine

## 2019-05-28 ENCOUNTER — Telehealth: Payer: Self-pay | Admitting: Family Medicine

## 2019-05-28 DIAGNOSIS — E7800 Pure hypercholesterolemia, unspecified: Secondary | ICD-10-CM

## 2019-05-28 NOTE — Progress Notes (Signed)
X-ray lumbar spine does have some arthritis.  Otherwise normal-appearing

## 2019-05-28 NOTE — Telephone Encounter (Signed)
Back x-ray does show some areas where the nerves could be pinched.  Or not cannot be able to see a bulging disc on x-ray.  An MRI would be helpful for that.  The x-ray is a precursor step to getting an MRI.  I do think we are going to either get you better without need an MRI or wind up getting an MRI which will show a bulging disc pressing on a nerve.

## 2019-05-28 NOTE — Telephone Encounter (Signed)
-----   Message from Evon Slack, [place] sent at 05/28/2019  8:27 AM EDT ----- Patient informed of results and is wanting to know if you know why she is having the pain in her R leg? Thought it was going to be a bone spur or bulging disc.  Patient wants to know if there is something else that she should be looking for or does she need to do a hip X-ray?

## 2019-05-28 NOTE — Progress Notes (Signed)
X-ray cervical spine does have some arthritis most prominent at C5-6 otherwise largely normal-appearing

## 2019-05-28 NOTE — Telephone Encounter (Signed)
Called pt and relayed all of Dr. Zollie Pee info as well as giving her additional info regarding the purpose of the MRI and what MRI shows vs what XR shows.  She asks about additional advice as to what else she can do for her leg pain I.e. massage, heat, ice, etc.  I advise her that all of those are ok and that she needs to try them and see if any help.  If so, then she should con't w/ those treatments.

## 2019-06-02 ENCOUNTER — Telehealth: Payer: Self-pay | Admitting: Family Medicine

## 2019-06-02 NOTE — Telephone Encounter (Signed)
Patient dropped off insurance form for Dr. Doreene Burke, gave to [place], requested fax back to insurance today.

## 2019-06-02 NOTE — Telephone Encounter (Signed)
Dr. Doreene Burke not in the office at the time of drop off so form can not be signed and faxed until he returns tomorrow. Clydie Braun aware when from given to me for signature.

## 2019-06-03 NOTE — Telephone Encounter (Signed)
Form filled out and faxed today

## 2019-06-06 ENCOUNTER — Other Ambulatory Visit: Payer: Self-pay | Admitting: Family Medicine

## 2019-06-06 DIAGNOSIS — E559 Vitamin D deficiency, unspecified: Secondary | ICD-10-CM

## 2019-06-08 ENCOUNTER — Ambulatory Visit: Payer: BC Managed Care – PPO | Attending: Family Medicine | Admitting: Physical Therapy

## 2019-06-08 ENCOUNTER — Other Ambulatory Visit: Payer: Self-pay | Admitting: Family Medicine

## 2019-06-08 ENCOUNTER — Other Ambulatory Visit: Payer: Self-pay

## 2019-06-08 ENCOUNTER — Encounter: Payer: Self-pay | Admitting: Physical Therapy

## 2019-06-08 ENCOUNTER — Telehealth: Payer: Self-pay | Admitting: Family Medicine

## 2019-06-08 DIAGNOSIS — B001 Herpesviral vesicular dermatitis: Secondary | ICD-10-CM

## 2019-06-08 DIAGNOSIS — M5441 Lumbago with sciatica, right side: Secondary | ICD-10-CM | POA: Diagnosis not present

## 2019-06-08 DIAGNOSIS — M6281 Muscle weakness (generalized): Secondary | ICD-10-CM | POA: Insufficient documentation

## 2019-06-08 DIAGNOSIS — M542 Cervicalgia: Secondary | ICD-10-CM | POA: Insufficient documentation

## 2019-06-08 DIAGNOSIS — B029 Zoster without complications: Secondary | ICD-10-CM

## 2019-06-08 NOTE — Telephone Encounter (Signed)
Pt stopped in and asked if she can get a refill on medication for cold sore, She said Dr Doreene Burke has filled it in the past. [facility]

## 2019-06-08 NOTE — Therapy (Signed)
[facility] [address] [place], [place], [address] Phone: [phone]   Fax:  [phone]  Physical Therapy Evaluation  Patient Details  Name: Brianna Luna MRN: [id number] Date of Birth: 04/15/1955 Referring Provider (PT): Dr Clementeen Graham    Encounter Date: 06/08/2019  PT End of Session - 06/08/19 1147    Visit Number  1    Number of Visits  12    Date for PT Re-Evaluation  07/20/19    Authorization Type  BCBS    Authorization - Visit Number  20    PT Start Time  1149    PT Stop Time  1241    PT Time Calculation (min)  52 min    Activity Tolerance  Patient tolerated treatment well    Behavior During Therapy  [facility] for tasks assessed/performed       History reviewed. No pertinent past medical history.  Past Surgical History:  Procedure Laterality Date  . ABDOMINAL HYSTERECTOMY    . CESAREAN SECTION      There were no vitals filed for this visit.   Subjective Assessment - 06/08/19 1148    Subjective  pt reports in 2009 she has a discectomy at L3/4 and she was told to keep an eye on the disc below.  She has narrowling in the low back vertebrea and arthritis in her neck. Currently Rt lower leg feels like it is on fire most of the time. MRI scheduled for Saturday         [facility] PT Assessment - 06/08/19 0001      Assessment   Medical Diagnosis  neck and low back pain     Referring Provider (PT)  Dr Clementeen Graham     Hand Dominance  Right    Next MD Visit  PRN or after MRI    Prior Therapy  yes before covid at another facility      Precautions   Precautions  None      Balance Screen   Has the patient fallen in the past 6 months  Yes    How many times?  1   off a step ladder, hit Left low back on a box this is after    Has the patient had a decrease in activity level because of a fear of falling?   No    Is the patient reluctant to leave their home because of a fear of falling?   No      Home Technical brewer residence    Living Arrangements  Spouse/significant other    Home Layout  Two level   difficulty      Prior Function   Level of Independence  Independent    [employer] - desk work     Leisure  spend time with family and friends , skiing - if able       Observation/Other Assessments   Focus on Therapeutic Outcomes (FOTO)   44% limited      Posture/Postural Control   Posture/Postural Control  Postural limitations    Postural Limitations  Forward head;Rounded Shoulders;Increased lumbar lordosis   depressed shoulder complex bilat      ROM / Strength   AROM / PROM / Strength  AROM;Strength      AROM   AROM Assessment Site  Shoulder;Cervical;Lumbar;Hip    Right/Left Shoulder  --   WNL   Cervical Flexion  WNL  Cervical Extension  WNL  slight pain iwth a pop ~ C6    Cervical - Right Rotation  70    Cervical - Left Rotation  58    Lumbar Flexion  to floor, flat spine     Lumbar Extension  WNL    Lumbar - Right Rotation  WNL    Lumbar - Left Rotation  WNL      Strength   Strength Assessment Site  Shoulder;Elbow;Hip;Knee;Ankle;Lumbar    Right/Left Shoulder  --   5/5   Right/Left Elbow  --   5/5   Right/Left Hip  --   5/5 back pain with resisted extension    Right/Left Knee  --   5/5   Right/Left Ankle  --   5/5   Lumbar Flexion  --   TA fair    Lumbar Extension  --   multifidi poor bilat      Flexibility   Soft Tissue Assessment /Muscle Length  --   LE WNL      Palpation   Spinal mobility  pain with CPA mobs at L3/4 and T 7-9    Palpation comment  tight in rt lower llumbar paraspinals however not painful       Special Tests   Other special tests  (-) SLR and slump bilat.                 Objective measurements completed on examination: See above findings.      [facility] PT Treatment/Exercise - 06/08/19 0001      Exercises   Exercises  Other Exercises    Other Exercises   childs pose flow  to/from prone press up, prone alt arm/leg lifts, hooklying isometric hip flex for core activation             PT Education - 06/08/19 1330    Education Details  HEP and POC, answered multiple questions for pt    Person(s) Educated  Patient    Methods  Explanation;Demonstration;Handout    Comprehension  Returned demonstration;Verbalized understanding          PT Long Term Goals - 06/08/19 1344      PT LONG TERM GOAL #1   Title  I with advanced HEP ( 518/2021)    Time  6    Period  Weeks    Status  New    Target Date  07/20/19      PT LONG TERM GOAL #2   Title  report decreased pain to allow =/> 50% improvement in her sleeping (5/18/021)    Time  6    Period  Weeks    Status  New    Target Date  07/20/19      PT LONG TERM GOAL #3   Title  demo normal cervical and lumbar ROM with no more than 2/10 pain ( 5/18/20201)    Time  6    Period  Weeks    Status  New    Target Date  07/20/19      PT LONG TERM GOAL #4   Title  improve FOTO =/< 36^ limited ( 07/20/2019)    Time  6    Period  Weeks    Status  New    Target Date  07/20/19      PT LONG TERM GOAL #5   Title  ambulated up/down stairs with alternating gait consistently with minimal to no pain ( 07/03/2019)    Time  6  Period  Weeks    Status  New    Target Date  07/20/19             Plan - 06/08/19 1330    Clinical Impression Statement  64 yo female with increased pain in her Rt LE and some in her back.  She is also referred for neck pain that is not as bad.She had a couple sessions of PT at another clinic before covid and had some upper body relief with dry needling, this did not help her low back or Rt leg. Over all her upper and lower body strength is good, she is weak in her core and has hypomobility throughout her spine.  She has some postural changes that are also placing stress on her back.  Pt is scheduled for an MRI this coming w/e to r/o disc issue in the lumbar region.  Her special tests for  this were negative in the clinic today. She woudl benefit from PT to address her core weakness, hypomobilty through the spine, reduce pain and restore funtion.    Personal Factors and Comorbidities  Comorbidity 3+    Comorbidities  osteopenia in the spine, arthritis.    Examination-Activity Limitations  Stairs;Lift    Examination-Participation Restrictions  Cleaning;Other    Stability/Clinical Decision Making  Stable/Uncomplicated    Clinical Decision Making  Low    Rehab Potential  Good    PT Frequency  2x / week    PT Duration  6 weeks    PT Treatment/Interventions  Iontophoresis 4mg /ml Dexamethasone;Taping;Patient/family education;Functional mobility training;Moist Heat;Traction;Ultrasound;Therapeutic exercise;Cryotherapy;Electrical Stimulation;Neuromuscular re-education;Manual techniques;Dry needling    PT Next Visit Plan  postural correction, core strength, see results of MRI, modalties PRN    Consulted and Agree with Plan of Care  Patient       Patient will benefit from skilled therapeutic intervention in order to improve the following deficits and impairments:  Decreased range of motion, Pain, Hypomobility, Postural dysfunction, Decreased strength  Visit Diagnosis: Cervicalgia - Plan: PT plan of care cert/re-cert  Acute right-sided low back pain with right-sided sciatica - Plan: PT plan of care cert/re-cert  Muscle weakness (generalized) - Plan: PT plan of care cert/re-cert     Problem List Patient Active Problem List   Diagnosis Date Noted  . Chronic right-sided low back pain 05/11/2019  . Strain of flank 05/11/2019  . Congestion of right ear 05/11/2019  . Osteopenia 06/23/2017  . Tick bite 06/23/2017  . Healthcare maintenance 06/23/2017  . Elevated LDL cholesterol level 05/13/2017  . Acquired hypothyroidism 05/13/2017  . Post-menopausal 05/13/2017  . Vitamin D deficiency 05/13/2017    07/13/2017 PT 06/08/2019, 1:50 PM  [facility] [address] [place], [place], [place] Phone: [phone]   Fax:  [phone]  Name: Kortlyn Koltz MRN: [name] Date of Birth: 04-14-1955

## 2019-06-08 NOTE — Patient Instructions (Signed)
Access Code: [facility] URL: [email or web address] Date: 06/08/2019 Prepared by: Roderic Scarce  Exercises Quadruped Rock Back into Prone Press Up - 1 x daily - 1 sets - 10 reps Prone Alternating Arm and Leg Lifts - 1 x daily - 1-3 sets - 10 reps Hooklying Isometric Hip Flexion - 1 x daily - 1-3 sets - 10 reps

## 2019-06-12 ENCOUNTER — Ambulatory Visit (INDEPENDENT_AMBULATORY_CARE_PROVIDER_SITE_OTHER): Payer: BC Managed Care – PPO

## 2019-06-12 ENCOUNTER — Other Ambulatory Visit: Payer: Self-pay

## 2019-06-12 DIAGNOSIS — M5441 Lumbago with sciatica, right side: Secondary | ICD-10-CM

## 2019-06-12 DIAGNOSIS — M5126 Other intervertebral disc displacement, lumbar region: Secondary | ICD-10-CM | POA: Diagnosis not present

## 2019-06-12 DIAGNOSIS — G8929 Other chronic pain: Secondary | ICD-10-CM | POA: Diagnosis not present

## 2019-06-14 ENCOUNTER — Telehealth: Payer: Self-pay | Admitting: Family Medicine

## 2019-06-14 DIAGNOSIS — R1907 Generalized intra-abdominal and pelvic swelling, mass and lump: Secondary | ICD-10-CM

## 2019-06-14 NOTE — Telephone Encounter (Signed)
Abdominal mass partially seen on lumbar spine MRI.  Radiology recommends CT scan to further evaluate this.  CT scan with oral and IV contrast ordered.  You should hear soon about scheduling in [place].

## 2019-06-14 NOTE — Progress Notes (Signed)
MRI shows several changes. Unexpectedly there is a partially visible mass in the abdomen.  Radiology recommends CT scan to further evaluate this.  I ordered CT scan of the abdomen pelvis to figure this out.  This would be with IV and oral contrast.  Kidney function was checked in early March and was okay.  The should be okay.The space where the nerves have to come out of the neuroforamen is a bit narrowed at L5 on the left and right.  This could potentially cause some of the pain down your leg.  Follow-up with me after CT scan to go over results.

## 2019-06-15 ENCOUNTER — Other Ambulatory Visit: Payer: Self-pay

## 2019-06-15 ENCOUNTER — Ambulatory Visit (INDEPENDENT_AMBULATORY_CARE_PROVIDER_SITE_OTHER): Payer: BC Managed Care – PPO

## 2019-06-15 DIAGNOSIS — R1907 Generalized intra-abdominal and pelvic swelling, mass and lump: Secondary | ICD-10-CM

## 2019-06-15 DIAGNOSIS — N281 Cyst of kidney, acquired: Secondary | ICD-10-CM | POA: Diagnosis not present

## 2019-06-15 MED ORDER — IOHEXOL 300 MG/ML  SOLN
100.0000 mL | Freq: Once | INTRAMUSCULAR | Status: AC | PRN
  Administered 2019-06-15: 100 mL via INTRAVENOUS

## 2019-06-16 NOTE — Progress Notes (Signed)
CT scan of the abdomen pelvis thankfully did not show any concern for cancer.  You have multiple large renal cysts.  The biggest is 15 cm.  I do not believe this requires any further follow-up however I will refer the matter back to your primary care provider.

## 2019-06-18 ENCOUNTER — Ambulatory Visit: Payer: BC Managed Care – PPO

## 2019-06-18 ENCOUNTER — Other Ambulatory Visit: Payer: Self-pay

## 2019-06-18 DIAGNOSIS — M6281 Muscle weakness (generalized): Secondary | ICD-10-CM | POA: Diagnosis not present

## 2019-06-18 DIAGNOSIS — M542 Cervicalgia: Secondary | ICD-10-CM

## 2019-06-18 DIAGNOSIS — M5441 Lumbago with sciatica, right side: Secondary | ICD-10-CM | POA: Diagnosis not present

## 2019-06-18 NOTE — Therapy (Signed)
[facility] [address] [place], [place], [address] Phone: [phone]   Fax:  [phone]  Physical Therapy Treatment  Patient Details  Name: Brianna Luna MRN: [id number] Date of Birth: 04/29/1955 Referring Provider (PT): Dr Clementeen Graham    Encounter Date: 06/18/2019  PT End of Session - 06/18/19 0921    Visit Number  2    Number of Visits  12    Date for PT Re-Evaluation  07/20/19    Authorization Type  BCBS    Authorization - Visit Number  20    PT Start Time  0848    PT Stop Time  0930    PT Time Calculation (min)  42 min    Activity Tolerance  Patient tolerated treatment well    Behavior During Therapy  [facility] for tasks assessed/performed       History reviewed. No pertinent past medical history.  Past Surgical History:  Procedure Laterality Date  . ABDOMINAL HYSTERECTOMY    . CESAREAN SECTION      There were no vitals filed for this visit.  Subjective Assessment - 06/18/19 0848    Subjective  Pt reports she has been doing her exercises and thinks that maybe that it is hurting a tiny bit less and less often. Pt reports she has a fatty lipoma on her back that she has had since before she moved here in 2016.    Currently in Pain?  Yes    Pain Score  3     Pain Location  Neck    Pain Descriptors / Indicators  Other (Comment)   stiffness        [facility] PT Assessment - 06/18/19 0001      AROM   Cervical - Right Side Bend  25    Cervical - Left Side Bend  30   pain   Cervical - Right Rotation  65    Cervical - Left Rotation  60   pain in L                  [facility] Adult PT Treatment/Exercise - 06/18/19 0001      Exercises   Exercises  Lumbar;Other Exercises      Lumbar Exercises: Stretches   Press Ups  10 reps    Press Ups Limitations  from child's pose to modified plank to prone to press-up      Lumbar Exercises: Prone   Opposite Arm/Leg Raise Limitations  eliminated arm raise  due to overcompensation of lumbar spine: focused on weighting pubic bone and maintaining connection of ASIS to mat before elevating thigh slightly off mat for true hip extension 1 x 10 B      Lumbar Exercises: Quadruped   Other Quadruped Lumbar Exercises  QP rocking to progress into child's pose to press-up      Manual Therapy   Manual Therapy  Joint mobilization;Soft tissue mobilization;Passive ROM    Manual therapy comments  closing restriction and myofascial restriction to L C/S    Joint Mobilization  grade III side glides L to R mid-c/s    Soft tissue mobilization  L C/S PS    Passive ROM  L rotation                  PT Long Term Goals - 06/08/19 1344      PT LONG TERM GOAL #1   Title  I with advanced HEP ( 518/2021)  Time  6    Period  Weeks    Status  New    Target Date  07/20/19      PT LONG TERM GOAL #2   Title  report decreased pain to allow =/> 50% improvement in her sleeping (5/18/021)    Time  6    Period  Weeks    Status  New    Target Date  07/20/19      PT LONG TERM GOAL #3   Title  demo normal cervical and lumbar ROM with no more than 2/10 pain ( 5/18/20201)    Time  6    Period  Weeks    Status  New    Target Date  07/20/19      PT LONG TERM GOAL #4   Title  improve FOTO =/< 36^ limited ( 07/20/2019)    Time  6    Period  Weeks    Status  New    Target Date  07/20/19      PT LONG TERM GOAL #5   Title  ambulated up/down stairs with alternating gait consistently with minimal to no pain ( 07/03/2019)    Time  6    Period  Weeks    Status  New    Target Date  07/20/19            Plan - 06/18/19 0923    Clinical Impression Statement  Pt presents with some improvement in neck pain from performing HEP and from initial evaluation. She demonstrates restriction in L C/S PS and in mid-cervical spine in closing for L lateral flexion and rotation. She responded well to manual therapy and modification of HEP. Verbal cues for maintaining  connection of shins to mat to facilitate core connection versus hip flexor compensation. Adjusted HEP slightly to modify for compensations and maximize effort.    Personal Factors and Comorbidities  Comorbidity 3+    Comorbidities  osteopenia in the spine, arthritis.    Examination-Activity Limitations  Stairs;Lift    Examination-Participation Restrictions  Cleaning;Other    Stability/Clinical Decision Making  Stable/Uncomplicated    Rehab Potential  Good    PT Frequency  2x / week    PT Duration  6 weeks    PT Treatment/Interventions  Iontophoresis 4mg /ml Dexamethasone;Taping;Patient/family education;Functional mobility training;Moist Heat;Traction;Ultrasound;Therapeutic exercise;Cryotherapy;Electrical Stimulation;Neuromuscular re-education;Manual techniques;Dry needling    PT Next Visit Plan  postural correction, core strength, see results of MRI, modalties PRN    Consulted and Agree with Plan of Care  Patient       Patient will benefit from skilled therapeutic intervention in order to improve the following deficits and impairments:  Decreased range of motion, Pain, Hypomobility, Postural dysfunction, Decreased strength  Visit Diagnosis: Cervicalgia  Acute right-sided low back pain with right-sided sciatica  Muscle weakness (generalized)     Problem List Patient Active Problem List   Diagnosis Date Noted  . Chronic right-sided low back pain 05/11/2019  . Strain of flank 05/11/2019  . Congestion of right ear 05/11/2019  . Osteopenia 06/23/2017  . Tick bite 06/23/2017  . Healthcare maintenance 06/23/2017  . Elevated LDL cholesterol level 05/13/2017  . Acquired hypothyroidism 05/13/2017  . Post-menopausal 05/13/2017  . Vitamin D deficiency 05/13/2017    07/13/2017, PT, DPT 06/18/2019, 10:36 AM  [facility] [address] [place], [place], [place] Phone: [phone]   Fax:  [phone]  Name: Brianna Luna MRN: [name] Date of  Birth: 10-Aug-1955

## 2019-06-22 ENCOUNTER — Ambulatory Visit: Payer: BC Managed Care – PPO | Admitting: Physical Therapy

## 2019-06-22 ENCOUNTER — Encounter: Payer: Self-pay | Admitting: Physical Therapy

## 2019-06-22 ENCOUNTER — Other Ambulatory Visit: Payer: Self-pay

## 2019-06-22 DIAGNOSIS — M6281 Muscle weakness (generalized): Secondary | ICD-10-CM

## 2019-06-22 DIAGNOSIS — M542 Cervicalgia: Secondary | ICD-10-CM | POA: Diagnosis not present

## 2019-06-22 DIAGNOSIS — M5441 Lumbago with sciatica, right side: Secondary | ICD-10-CM | POA: Diagnosis not present

## 2019-06-22 NOTE — Therapy (Signed)
[facility] [place] [address] [place], [place], [address] Phone: [phone]   Fax:  [phone]  Physical Therapy Treatment  Patient Details  Name: Brianna Luna MRN: [id number] Date of Birth: March 13, 1955 Referring Provider (PT): Dr Clementeen Graham    Encounter Date: 06/22/2019  PT End of Session - 06/22/19 1525    Visit Number  3    Number of Visits  12    Date for PT Re-Evaluation  07/20/19    Authorization - Visit Number  20    PT Start Time  1340    PT Stop Time  1423    PT Time Calculation (min)  43 min    Activity Tolerance  Patient tolerated treatment well    Behavior During Therapy  [facility] for tasks assessed/performed       History reviewed. No pertinent past medical history.  Past Surgical History:  Procedure Laterality Date  . ABDOMINAL HYSTERECTOMY    . CESAREAN SECTION      There were no vitals filed for this visit.  Subjective Assessment - 06/22/19 1320    Subjective  Reports that the biggest issue is right shin pain, difficulty sleeping at night.  C/O tightness in the neck and the back    Currently in Pain?  Yes    Pain Score  2     Pain Location  Back    Pain Descriptors / Indicators  Aching    Pain Radiating Towards  right shin    Aggravating Factors   sleeping at night                       [facility] Adult PT Treatment/Exercise - 06/22/19 0001      Lumbar Exercises: Aerobic   Nustep  level 5 x 5 minutes      Lumbar Exercises: Machines for Strengthening   Other Lumbar Machine Exercise  straight arm pulls 10# with cues for core engagement and posture, 5# hip extension and abduction    Other Lumbar Machine Exercise  seated rows 20#, lats 20# 2x10 each      Lumbar Exercises: Supine   Other Supine Lumbar Exercises  feet on ball K2C, trunk rotation, bridges, isometric abs      Lumbar Exercises: Prone   Straight Leg Raise  10 reps;2 seconds    Other Prone Lumbar Exercises  anterior  pelvic tilts      Manual Therapy   Manual Therapy  Manual Traction    Manual Traction  sheet lumbar traction, educated on this at home and to hole 30-60 s             PT Education - 06/22/19 1524    Education Details  HEP with ball, sheet traction    Person(s) Educated  Patient    Methods  Explanation;Demonstration;Handout;Tactile cues    Comprehension  Verbalized understanding;Returned demonstration          PT Long Term Goals - 06/22/19 1529      PT LONG TERM GOAL #1   Title  I with advanced HEP ( 518/2021)    Status  On-going      PT LONG TERM GOAL #2   Title  report decreased pain to allow =/> 50% improvement in her sleeping (5/18/021)    Status  On-going      PT LONG TERM GOAL #3   Title  demo normal cervical and lumbar ROM with no more than 2/10 pain (  5/18/20201)    Status  On-going            Plan - 06/22/19 1526    Clinical Impression Statement  Patient reports weak core, strong legs.  She requires cues verbal and tactile for core activation and posture, she had some difficulty with the hip abduction.  She did report that the traction felt good.  I tried the pelvic traction with a sheet to see if this helped with the right shin pain.    PT Next Visit Plan  could try AR press to engage the core    Consulted and Agree with Plan of Care  Patient       Patient will benefit from skilled therapeutic intervention in order to improve the following deficits and impairments:  Decreased range of motion, Pain, Hypomobility, Postural dysfunction, Decreased strength  Visit Diagnosis: Cervicalgia  Acute right-sided low back pain with right-sided sciatica  Muscle weakness (generalized)     Problem List Patient Active Problem List   Diagnosis Date Noted  . Chronic right-sided low back pain 05/11/2019  . Strain of flank 05/11/2019  . Congestion of right ear 05/11/2019  . Osteopenia 06/23/2017  . Tick bite 06/23/2017  . Healthcare maintenance 06/23/2017   . Elevated LDL cholesterol level 05/13/2017  . Acquired hypothyroidism 05/13/2017  . Post-menopausal 05/13/2017  . Vitamin D deficiency 05/13/2017    Sumner Boast., PT 06/22/2019, 3:30 PM  [place] [address] [place], [place], [address] Phone: [phone]   Fax:  [phone]  Name: Jayra Choyce MRN: [id number] Date of Birth: 05/08/1955

## 2019-06-24 ENCOUNTER — Ambulatory Visit: Payer: BC Managed Care – PPO | Admitting: Physical Therapy

## 2019-06-24 ENCOUNTER — Other Ambulatory Visit: Payer: Self-pay

## 2019-06-24 ENCOUNTER — Encounter: Payer: Self-pay | Admitting: Physical Therapy

## 2019-06-24 DIAGNOSIS — M542 Cervicalgia: Secondary | ICD-10-CM

## 2019-06-24 DIAGNOSIS — M5441 Lumbago with sciatica, right side: Secondary | ICD-10-CM | POA: Diagnosis not present

## 2019-06-24 DIAGNOSIS — M6281 Muscle weakness (generalized): Secondary | ICD-10-CM | POA: Diagnosis not present

## 2019-06-24 NOTE — Therapy (Signed)
[facility] [address] [place], [place], [address] Phone: [phone]   Fax:  [phone]  Physical Therapy Treatment  Patient Details  Name: Brianna Luna MRN: [id number] Date of Birth: Jul 10, 1955 Referring Provider (PT): Dr Clementeen Graham    Encounter Date: 06/24/2019  PT End of Session - 06/24/19 1349    Visit Number  4    Date for PT Re-Evaluation  07/20/19    PT Start Time  1303    PT Stop Time  1349    PT Time Calculation (min)  46 min    Activity Tolerance  Patient tolerated treatment well    Behavior During Therapy  [facility] for tasks assessed/performed       History reviewed. No pertinent past medical history.  Past Surgical History:  Procedure Laterality Date  . ABDOMINAL HYSTERECTOMY    . CESAREAN SECTION      There were no vitals filed for this visit.  Subjective Assessment - 06/24/19 1307    Subjective  Neck is super stiff, shoulder not so good, R leg is waking her up in her sleep at nigh    Currently in Pain?  Yes    Pain Score  4     Pain Location  Shoulder                       [facility] Adult PT Treatment/Exercise - 06/24/19 0001      Lumbar Exercises: Aerobic   Nustep  level 5 x 6 minutes      Lumbar Exercises: Machines for Strengthening   Other Lumbar Machine Exercise  straight arm pulls 20# with cues for core engagement and posture, 5# hip extension and abduction    Other Lumbar Machine Exercise  seated rows 20#, lats 20# 2x10 each      Lumbar Exercises: Standing   Other Standing Lumbar Exercises  AR presses 10lb 2x10       Lumbar Exercises: Supine   Ab Set  10 reps;2 seconds    Other Supine Lumbar Exercises  feet on ball K2C, trunk rotation, bridges, isometric abs      Manual Therapy   Manual Therapy  Manual Traction    Manual Traction  sheet lumbar traction, educated on this at home and to hole 30-60 s             PT Education - 06/24/19 1353    Education  Details  Sheet traction    Person(s) Educated  Patient;Child(ren)    Methods  Explanation;Demonstration;Verbal cues    Comprehension  Verbalized understanding;Returned demonstration          PT Long Term Goals - 06/22/19 1529      PT LONG TERM GOAL #1   Title  I with advanced HEP ( 518/2021)    Status  On-going      PT LONG TERM GOAL #2   Title  report decreased pain to allow =/> 50% improvement in her sleeping (5/18/021)    Status  On-going      PT LONG TERM GOAL #3   Title  demo normal cervical and lumbar ROM with no more than 2/10 pain ( 5/18/20201)    Status  On-going            Plan - 06/24/19 1351    Clinical Impression Statement  Pt continues to require verbal and tactile cues for posture and core engagement. A lot of fatigue reported in the anchoring  leg with standing hip exercises. Continues to have a positive response to sheet traction. Pt's daughter was present today so she could learn how to perform sheet traction at home.    Personal Factors and Comorbidities  Comorbidity 3+    Comorbidities  osteopenia in the spine, arthritis.    Examination-Activity Limitations  Stairs;Lift    Examination-Participation Restrictions  Cleaning;Other    Rehab Potential  Good    PT Frequency  2x / week    PT Duration  6 weeks    PT Treatment/Interventions  Iontophoresis 4mg /ml Dexamethasone;Taping;Patient/family education;Functional mobility training;Moist Heat;Traction;Ultrasound;Therapeutic exercise;Cryotherapy;Electrical Stimulation;Neuromuscular re-education;Manual techniques;Dry needling    PT Next Visit Plan  Core strenght and stability       Patient will benefit from skilled therapeutic intervention in order to improve the following deficits and impairments:  Decreased range of motion, Pain, Hypomobility, Postural dysfunction, Decreased strength  Visit Diagnosis: Muscle weakness (generalized)  Acute right-sided low back pain with right-sided  sciatica  Cervicalgia     Problem List Patient Active Problem List   Diagnosis Date Noted  . Chronic right-sided low back pain 05/11/2019  . Strain of flank 05/11/2019  . Congestion of right ear 05/11/2019  . Osteopenia 06/23/2017  . Tick bite 06/23/2017  . Healthcare maintenance 06/23/2017  . Elevated LDL cholesterol level 05/13/2017  . Acquired hypothyroidism 05/13/2017  . Post-menopausal 05/13/2017  . Vitamin D deficiency 05/13/2017    Scot Jun, PTA 06/24/2019, 1:53 PM  [place] [address] [place], [place], [address] Phone: [phone]   Fax:  [phone]  Name: Brianna Luna MRN: [id number] Date of Birth: 1955-11-22

## 2019-06-30 ENCOUNTER — Other Ambulatory Visit: Payer: BC Managed Care – PPO

## 2019-06-30 ENCOUNTER — Other Ambulatory Visit: Payer: Self-pay

## 2019-06-30 ENCOUNTER — Ambulatory Visit: Payer: BC Managed Care – PPO | Admitting: Physical Therapy

## 2019-06-30 DIAGNOSIS — M6281 Muscle weakness (generalized): Secondary | ICD-10-CM | POA: Diagnosis not present

## 2019-06-30 DIAGNOSIS — M5441 Lumbago with sciatica, right side: Secondary | ICD-10-CM | POA: Diagnosis not present

## 2019-06-30 DIAGNOSIS — M542 Cervicalgia: Secondary | ICD-10-CM | POA: Diagnosis not present

## 2019-06-30 NOTE — Therapy (Signed)
[place] [address] [place], [place], [address] Phone: [phone]   Fax:  [phone]  Physical Therapy Treatment  Patient Details  Name: Brianna Luna MRN: [id number] Date of Birth: October 30, 1955 Referring Provider (PT): Dr Lynne Leader    Encounter Date: 06/30/2019  PT End of Session - 06/30/19 1019    Visit Number  5    Number of Visits  12    Date for PT Re-Evaluation  07/20/19    PT Start Time  0933    PT Stop Time  1015    PT Time Calculation (min)  42 min    Activity Tolerance  Patient tolerated treatment well    Behavior During Therapy  [facility] for tasks assessed/performed       No past medical history on file.  Past Surgical History:  Procedure Laterality Date  . ABDOMINAL HYSTERECTOMY    . CESAREAN SECTION      There were no vitals filed for this visit.  Subjective Assessment - 06/30/19 0937    Subjective  says shes doing well, neck is really tight    Currently in Pain?  No/denies    Pain Score  0-No pain                       [facility] Adult PT Treatment/Exercise - 06/30/19 0001      Exercises   Exercises  Shoulder      Lumbar Exercises: Aerobic   Nustep  level 5 x 6 minutes      Lumbar Exercises: Standing   Other Standing Lumbar Exercises  presses w/ pulley, 2x10   20#; cues to engage core     Lumbar Exercises: Supine   Ab Set  10 reps;3 seconds   cuing to engage core   Dead Bug  20 reps   w/ exercise ball     Shoulder Exercises: ROM/Strengthening   Lat Pull  2.5 plate;20 reps    Cybex Row  2 plate;20 reps    Other ROM/Strengthening Exercises  shld ext w/ pulleys, 2x10, 15#      Manual Therapy   Manual Therapy  Manual Traction    Manual Traction  sheet lumbar traction, educated on this at home and to hole 30-60 s                  PT Long Term Goals - 06/30/19 1019      PT LONG TERM GOAL #1   Title  I with advanced HEP ( 518/2021)    Time  6     Period  Weeks    Status  Partially Met      PT LONG TERM GOAL #2   Title  report decreased pain to allow =/> 50% improvement in her sleeping (5/18/021)    Time  6    Period  Weeks    Status  On-going            Plan - 06/30/19 1008    Clinical Impression Statement  Pt needs much cuing for core enagement during exercises, mild difficulty shown with the pulley core exercises, she seems to understand good posture while performing row exercises, manual traction applied at the end of treatment.    Personal Factors and Comorbidities  Comorbidity 3+    Comorbidities  osteopenia in the spine, arthritis.    Examination-Activity Limitations  Stairs;Lift    Examination-Participation Restrictions  Cleaning;Other    Rehab Potential  Good    PT Frequency  2x / week    PT Duration  6 weeks    PT Treatment/Interventions  Iontophoresis 63m/ml Dexamethasone;Taping;Patient/family education;Functional mobility training;Moist Heat;Traction;Ultrasound;Therapeutic exercise;Cryotherapy;Electrical Stimulation;Neuromuscular re-education;Manual techniques;Dry needling    PT Next Visit Plan  Core strenght and stability       Patient will benefit from skilled therapeutic intervention in order to improve the following deficits and impairments:  Decreased range of motion, Pain, Hypomobility, Postural dysfunction, Decreased strength  Visit Diagnosis: Muscle weakness (generalized)  Acute right-sided low back pain with right-sided sciatica  Cervicalgia     Problem List Patient Active Problem List   Diagnosis Date Noted  . Chronic right-sided low back pain 05/11/2019  . Strain of flank 05/11/2019  . Congestion of right ear 05/11/2019  . Osteopenia 06/23/2017  . Tick bite 06/23/2017  . Healthcare maintenance 06/23/2017  . Elevated LDL cholesterol level 05/13/2017  . Acquired hypothyroidism 05/13/2017  . Post-menopausal 05/13/2017  . Vitamin D deficiency 05/13/2017    BClarene Luna  SPTA 06/30/2019, 10:21 AM  [address] [address]Phone: [phone]  Fax:  [phone] Name: AApryl BrymerMRN: [id number] of Birth: [birth year]

## 2019-07-02 ENCOUNTER — Encounter: Payer: BC Managed Care – PPO | Admitting: Physical Therapy

## 2019-07-08 ENCOUNTER — Other Ambulatory Visit: Payer: Self-pay

## 2019-07-08 ENCOUNTER — Ambulatory Visit: Payer: BC Managed Care – PPO | Attending: Family Medicine | Admitting: Physical Therapy

## 2019-07-08 ENCOUNTER — Encounter: Payer: Self-pay | Admitting: Physical Therapy

## 2019-07-08 DIAGNOSIS — M5441 Lumbago with sciatica, right side: Secondary | ICD-10-CM | POA: Insufficient documentation

## 2019-07-08 DIAGNOSIS — M6281 Muscle weakness (generalized): Secondary | ICD-10-CM | POA: Diagnosis not present

## 2019-07-08 DIAGNOSIS — M542 Cervicalgia: Secondary | ICD-10-CM | POA: Insufficient documentation

## 2019-07-08 NOTE — Therapy (Signed)
[place], [place], [address] Phone: [phone]   Fax:  [phone]  Physical Therapy Treatment  Patient Details  Name: Brianna Luna MRN: [id number] Date of Birth: 1955-08-30 Referring Provider (PT): Dr Lynne Leader    Encounter Date: 07/08/2019  PT End of Session - 07/08/19 0933    Visit Number  6    Number of Visits  12    Authorization Type  BCBS    PT Start Time  [phone]    PT Stop Time  0930    PT Time Calculation (min)  39 min    Activity Tolerance  Patient tolerated treatment well       History reviewed. No pertinent past medical history.  Past Surgical History:  Procedure Laterality Date  . ABDOMINAL HYSTERECTOMY    . CESAREAN SECTION      There were no vitals filed for this visit.  Subjective Assessment - 07/08/19 0853    Subjective  "OK" Has not been able to do HEP cause of a trip she had to take    Currently in Pain?  Yes    Pain Score  3     Pain Location  Shoulder    Pain Orientation  Right         [facility] PT Assessment - 07/08/19 0001      AROM   Cervical Flexion  WNL    Cervical Extension  WNL with pain     Cervical - Right Side Bend  decrease 25%    Cervical - Left Side Bend  decrease 505     Cervical - Right Rotation  decrease 25%    Cervical - Left Rotation  decrease 25%     Lumbar Extension  WNL    Lumbar - Right Rotation  WNL    Lumbar - Left Rotation  WNL                   [facility] Adult PT Treatment/Exercise - 07/08/19 0001      Lumbar Exercises: Aerobic   Nustep  level 5 x 6 minutes      Lumbar Exercises: Machines for Strengthening   Cybex Lumbar Extension  black band 2x15     Other Lumbar Machine Exercise  seated rows 20#, lats 25# 2x10 each      Shoulder Exercises: Standing   External Rotation  Strengthening;Both;Theraband;20 reps    Theraband Level (Shoulder External Rotation)  Level 2 (Red)    Other Standing Exercises  Cervical  Retractions 2x10     Other Standing Exercises  Shrugs with levater stretch 5lb      Shoulder Exercises: ROM/Strengthening   Other ROM/Strengthening Exercises  shld ext w/ pulleys, 2x10, 10#      Manual Therapy   Manual Therapy  Soft tissue mobilization;Passive ROM;Manual Traction    Soft tissue mobilization  Cervical paraspinales and STM    Passive ROM  Cervical spine all directions with end range stretching    Manual Traction  cervical spine 6 x 20sec                  PT Long Term Goals - 07/08/19 0855      PT LONG TERM GOAL #1   Title  I with advanced HEP ( 518/2021)    Status  Achieved      PT LONG TERM GOAL #2   Title  report decreased pain to allow =/> 50% improvement in her  sleeping (5/18/021)    Status  Achieved            Plan - 07/08/19 0934    Clinical Impression Statement  Pt has progressed increasing her cervical and lumbar ROM, but did have some pain with some motions. Good effort noted with all exercises. Postural cues needed with shoulder external rotation. Repots strong pull with levator stretch. Increase tissue elasticity noted with MT evident by increase cervical motions with less tightness.    Personal Factors and Comorbidities  Comorbidity 3+    Comorbidities  osteopenia in the spine, arthritis.    Examination-Activity Limitations  Stairs;Lift    Examination-Participation Restrictions  Cleaning;Other    Stability/Clinical Decision Making  Stable/Uncomplicated    Rehab Potential  Good    PT Frequency  2x / week    PT Duration  6 weeks    PT Treatment/Interventions  Iontophoresis 4mg /ml Dexamethasone;Taping;Patient/family education;Functional mobility training;Moist Heat;Traction;Ultrasound;Therapeutic exercise;Cryotherapy;Electrical Stimulation;Neuromuscular re-education;Manual techniques;Dry needling    PT Next Visit Plan  Core strength and stability, assess cervical MT       Patient will benefit from skilled therapeutic intervention in  order to improve the following deficits and impairments:  Decreased range of motion, Pain, Hypomobility, Postural dysfunction, Decreased strength  Visit Diagnosis: Cervicalgia  Acute right-sided low back pain with right-sided sciatica  Muscle weakness (generalized)     Problem List Patient Active Problem List   Diagnosis Date Noted  . Chronic right-sided low back pain 05/11/2019  . Strain of flank 05/11/2019  . Congestion of right ear 05/11/2019  . Osteopenia 06/23/2017  . Tick bite 06/23/2017  . Healthcare maintenance 06/23/2017  . Elevated LDL cholesterol level 05/13/2017  . Acquired hypothyroidism 05/13/2017  . Post-menopausal 05/13/2017  . Vitamin D deficiency 05/13/2017    07/13/2017, PTA 07/08/2019, 9:36 AM  [place] [facility] [place] [address] [place], [place], [place] Phone: [phone]   Fax:  [phone]  Name: Brianna Luna MRN: [name] Date of Birth: 07-25-1955

## 2019-07-20 ENCOUNTER — Encounter: Payer: Self-pay | Admitting: Physical Therapy

## 2019-07-20 ENCOUNTER — Ambulatory Visit: Payer: BC Managed Care – PPO | Admitting: Physical Therapy

## 2019-07-20 ENCOUNTER — Other Ambulatory Visit: Payer: Self-pay

## 2019-07-20 DIAGNOSIS — M5441 Lumbago with sciatica, right side: Secondary | ICD-10-CM | POA: Diagnosis not present

## 2019-07-20 DIAGNOSIS — M6281 Muscle weakness (generalized): Secondary | ICD-10-CM | POA: Diagnosis not present

## 2019-07-20 DIAGNOSIS — M542 Cervicalgia: Secondary | ICD-10-CM | POA: Diagnosis not present

## 2019-07-20 NOTE — Patient Instructions (Signed)
Access Code: [email or web address] URL: [email or web address] Date: 07/20/2019 Prepared by: Stacie Glaze  Exercises Shoulder extension with resistance - Neutral - 1 x daily - 7 x weekly - 3 sets - 10 reps - 2 hold Standing Bilateral Low Shoulder Row with Anchored Resistance - 1 x daily - 7 x weekly - 3 sets - 10 reps - 2 hold Standing Hip Extension with Anchored Resistance - 1 x daily - 7 x weekly - 3 sets - 10 reps - 2 hold Standing Repeated Hip Abduction with Resistance - 1 x daily - 7 x weekly - 3 sets - 10 reps - 2 hold Seated Piriformis Stretch with Trunk Bend - 1 x daily - 7 x weekly - 3 sets - 3 reps - 30 hold Supine Dead Bug with Leg Extension - 1 x daily - 7 x weekly - 1 sets - 10 reps - 1 hold

## 2019-07-20 NOTE — Therapy (Signed)
[place] [address] [place], [place], [address] Phone: [phone]   Fax:  [phone]  Physical Therapy Treatment  Patient Details  Name: Brianna Luna MRN: [id number] Date of Birth: 03-13-1955 Referring Provider (PT): Dr Lynne Leader    Encounter Date: 07/20/2019  PT End of Session - 07/20/19 1012    Visit Number  7    Authorization Type  BCBS    PT Start Time  0930    PT Stop Time  1012    PT Time Calculation (min)  42 min    Activity Tolerance  Patient tolerated treatment well    Behavior During Therapy  [facility] for tasks assessed/performed       History reviewed. No pertinent past medical history.  Past Surgical History:  Procedure Laterality Date  . ABDOMINAL HYSTERECTOMY    . CESAREAN SECTION      There were no vitals filed for this visit.  Subjective Assessment - 07/20/19 0936    Subjective  Patient reports that she has been feeling really good lately.  reports that she will be doing a lot of road trips this summer and would like to have HEP that she can do easily    Currently in Pain?  Yes    Pain Score  2     Pain Location  Neck    Pain Orientation  Right    Pain Descriptors / Indicators  Sore    Aggravating Factors   stress    Pain Relieving Factors  the treatment                        [facility] Adult PT Treatment/Exercise - 07/20/19 0001      Self-Care   Self-Care  ADL's;Lifting;Posture    ADL's  vacuum    Lifting  golfer's, legs, no twisting, weight close    Posture  posture and stretches for long car trips      Lumbar Exercises: Aerobic   UBE (Upper Arm Bike)  Level 4 x 4 minute      Lumbar Exercises: Supine   Dead Bug  20 reps      Lumbar Exercises: Prone   Straight Leg Raise  10 reps;2 seconds      Shoulder Exercises: Standing   Other Standing Exercises  Cervical Retractions 2x10     Other Standing Exercises  Shrugs with levater stretch 5lb                   PT Long Term Goals - 07/20/19 1014      PT LONG TERM GOAL #1   Title  I with advanced HEP ( 518/2021)    Status  Achieved      PT LONG TERM GOAL #2   Title  report decreased pain to allow =/> 50% improvement in her sleeping (5/18/021)    Status  Achieved      PT LONG TERM GOAL #3   Title  demo normal cervical and lumbar ROM with no more than 2/10 pain ( 5/18/20201)    Status  Partially Met      PT LONG TERM GOAL #4   Title  improve FOTO =/< 36^ limited ( 07/20/2019)    Status  Achieved      PT LONG TERM GOAL #5   Title  ambulated up/down stairs with alternating gait consistently with minimal to no pain ( 07/03/2019)    Status  Partially Met  Plan - 07/20/19 1012    Clinical Impression Statement  Patient will be doing a lot of traveling and some renovation work the next month or two, we went over an advanced HEP, posture and body mechanics, stretches for car rides, PT demo and patient verbalized understanding and return demo.    PT Next Visit Plan  hold treatment as she will be travelling, she asked to be able to call and return if things do not go well on her trips this summer.    Consulted and Agree with Plan of Care  Patient       Patient will benefit from skilled therapeutic intervention in order to improve the following deficits and impairments:  Decreased range of motion, Pain, Hypomobility, Postural dysfunction, Decreased strength  Visit Diagnosis: Cervicalgia  Acute right-sided low back pain with right-sided sciatica  Muscle weakness (generalized)     Problem List Patient Active Problem List   Diagnosis Date Noted  . Chronic right-sided low back pain 05/11/2019  . Strain of flank 05/11/2019  . Congestion of right ear 05/11/2019  . Osteopenia 06/23/2017  . Tick bite 06/23/2017  . Healthcare maintenance 06/23/2017  . Elevated LDL cholesterol level 05/13/2017  . Acquired hypothyroidism 05/13/2017  . Post-menopausal  05/13/2017  . Vitamin D deficiency 05/13/2017    Sumner Boast., PT 07/20/2019, 10:17 AM  [place] [address] [place], [place], [address] Phone: [phone]   Fax:  [phone]  Name: Brianna Luna MRN: [id number] Date of Birth: Oct 08, 1955

## 2019-11-12 ENCOUNTER — Other Ambulatory Visit: Payer: Self-pay | Admitting: Family Medicine

## 2019-11-12 DIAGNOSIS — E559 Vitamin D deficiency, unspecified: Secondary | ICD-10-CM

## 2019-12-13 ENCOUNTER — Other Ambulatory Visit (HOSPITAL_BASED_OUTPATIENT_CLINIC_OR_DEPARTMENT_OTHER): Payer: Self-pay | Admitting: Family Medicine

## 2019-12-13 ENCOUNTER — Other Ambulatory Visit: Payer: Self-pay

## 2019-12-13 ENCOUNTER — Telehealth: Payer: Self-pay | Admitting: Family Medicine

## 2019-12-13 DIAGNOSIS — E039 Hypothyroidism, unspecified: Secondary | ICD-10-CM

## 2019-12-13 DIAGNOSIS — E7800 Pure hypercholesterolemia, unspecified: Secondary | ICD-10-CM

## 2019-12-13 DIAGNOSIS — Z1231 Encounter for screening mammogram for malignant neoplasm of breast: Secondary | ICD-10-CM

## 2019-12-13 MED ORDER — LEVOTHYROXINE SODIUM 75 MCG PO TABS: ORAL_TABLET | ORAL | 3 refills | Status: DC

## 2019-12-13 MED ORDER — ATORVASTATIN CALCIUM 20 MG PO TABS: 20.0000 mg | ORAL_TABLET | Freq: Every day | ORAL | 1 refills | Status: DC

## 2019-12-13 NOTE — Telephone Encounter (Signed)
Patient is calling and requesting a refill for atorvastatin and levothyroxine sent to [facility] [address], please advise. CB [phone]

## 2019-12-13 NOTE — Telephone Encounter (Signed)
Rx sent in

## 2019-12-14 ENCOUNTER — Other Ambulatory Visit: Payer: Self-pay | Admitting: Family Medicine

## 2019-12-14 DIAGNOSIS — E7800 Pure hypercholesterolemia, unspecified: Secondary | ICD-10-CM

## 2019-12-20 ENCOUNTER — Other Ambulatory Visit: Payer: Self-pay

## 2019-12-20 ENCOUNTER — Encounter: Payer: Self-pay | Admitting: Family Medicine

## 2019-12-20 ENCOUNTER — Ambulatory Visit (INDEPENDENT_AMBULATORY_CARE_PROVIDER_SITE_OTHER): Payer: BC Managed Care – PPO | Admitting: Family Medicine

## 2019-12-20 VITALS — BP 124/72 | HR 69 | Temp 97.1°F | Ht 64.0 in | Wt 172.2 lb

## 2019-12-20 DIAGNOSIS — E559 Vitamin D deficiency, unspecified: Secondary | ICD-10-CM | POA: Diagnosis not present

## 2019-12-20 DIAGNOSIS — R079 Chest pain, unspecified: Secondary | ICD-10-CM

## 2019-12-20 DIAGNOSIS — E7800 Pure hypercholesterolemia, unspecified: Secondary | ICD-10-CM | POA: Diagnosis not present

## 2019-12-20 DIAGNOSIS — Z0000 Encounter for general adult medical examination without abnormal findings: Secondary | ICD-10-CM

## 2019-12-20 DIAGNOSIS — E039 Hypothyroidism, unspecified: Secondary | ICD-10-CM

## 2019-12-20 DIAGNOSIS — Z23 Encounter for immunization: Secondary | ICD-10-CM

## 2019-12-20 LAB — URINALYSIS, ROUTINE W REFLEX MICROSCOPIC
Bilirubin Urine: NEGATIVE
Hgb urine dipstick: NEGATIVE
Ketones, ur: NEGATIVE
Leukocytes,Ua: NEGATIVE
Nitrite: NEGATIVE
RBC / HPF: NONE SEEN (ref 0–?)
Specific Gravity, Urine: 1.02 (ref 1.000–1.030)
Total Protein, Urine: NEGATIVE
Urine Glucose: NEGATIVE
Urobilinogen, UA: 0.2 (ref 0.0–1.0)
pH: 6 (ref 5.0–8.0)

## 2019-12-20 LAB — COMPREHENSIVE METABOLIC PANEL
ALT: 15 U/L (ref 0–35)
AST: 15 U/L (ref 0–37)
Albumin: 4.5 g/dL (ref 3.5–5.2)
Alkaline Phosphatase: 92 U/L (ref 39–117)
BUN: 18 mg/dL (ref 6–23)
CO2: 27 mEq/L (ref 19–32)
Calcium: 9.4 mg/dL (ref 8.4–10.5)
Chloride: 105 mEq/L (ref 96–112)
Creatinine, Ser: 0.81 mg/dL (ref 0.40–1.20)
GFR: 76.24 mL/min (ref >60.00)
Glucose, Bld: 77 mg/dL (ref 70–99)
Potassium: 4.2 mEq/L (ref 3.5–5.1)
Sodium: 140 mEq/L (ref 135–145)
Total Bilirubin: 0.9 mg/dL (ref 0.2–1.2)
Total Protein: 6.8 g/dL (ref 6.0–8.3)

## 2019-12-20 LAB — LIPID PANEL
Cholesterol: 162 mg/dL (ref 0–200)
HDL: 54.9 mg/dL (ref >39.00)
LDL Cholesterol: 91 mg/dL (ref 0–99)
NonHDL: 106.74
Total CHOL/HDL Ratio: 3
Triglycerides: 80 mg/dL (ref 0.0–149.0)
VLDL: 16 mg/dL (ref 0.0–40.0)

## 2019-12-20 LAB — CBC
HCT: 41 % (ref 36.0–46.0)
Hemoglobin: 13.9 g/dL (ref 12.0–15.0)
MCHC: 33.9 g/dL (ref 30.0–36.0)
MCV: 90.6 fl (ref 78.0–100.0)
Platelets: 215 10*3/uL (ref 150.0–400.0)
RBC: 4.52 Mil/uL (ref 3.87–5.11)
RDW: 12.9 % (ref 11.5–15.5)
WBC: 4.7 10*3/uL (ref 4.0–10.5)

## 2019-12-20 LAB — VITAMIN D 25 HYDROXY (VIT D DEFICIENCY, FRACTURES): VITD: 39.18 ng/mL (ref 30.00–100.00)

## 2019-12-20 LAB — TSH: TSH: 0.39 u[IU]/mL (ref 0.35–4.50)

## 2019-12-20 NOTE — Progress Notes (Addendum)
Established Patient Office Visit  Subjective:  Patient ID: Brianna Luna, female    DOB: April 15, 1955  Age: 64 y.o. MRN: [id number]  CC:  Chief Complaint  Patient presents with  . Follow-up    Follow up on medications, C/O pain in left breast few weeks ago.     HPI Brianna Luna presents for follow-up of elevated ldl cholesterol, hypothyroidism, vitamin D deficiency and a couple of episodes of chest pain.  Chest pains have been sharp, fleeting, occurred in the left upper chest area and appeared to be superficial.  There was no radiation into the neck or into the arm.  They were not associated with nausea, diaphoresis shortness of breath or dyspnea.  They have occurred at random times.  Have not occurred when she has been walking her dog or otherwise exerting herself.  There has been having this in the chest area after the pains.  She does have a significant history of elevated ldl cholesterol.  She does not smoke.  She does not have hypertension.  Mammogram is scheduled for next week.  History reviewed. No pertinent past medical history.  Past Surgical History:  Procedure Laterality Date  . ABDOMINAL HYSTERECTOMY    . CESAREAN SECTION      Family History  Problem Relation Age of Onset  . COPD Mother   . Heart disease Mother     Social History   Socioeconomic History  . Marital status: Married    Spouse name: Not on file  . Number of children: Not on file  . Years of education: Not on file  . Highest education level: Not on file  Occupational History  . Not on file  Tobacco Use  . Smoking status: Never Smoker  . Smokeless tobacco: Never Used  Vaping Use  . Vaping Use: Never used  Substance and Sexual Activity  . Alcohol use: Yes    Comment: ocass.  . Drug use: No  . Sexual activity: Not on file  Other Topics Concern  . Not on file  Social History Narrative  . Not on file   Social Determinants of Health   Financial Resource Strain:   . Difficulty of Paying  Living Expenses: Not on file  Food Insecurity:   . Worried About Programme researcher, broadcasting/film/video in the Last Year: Not on file  . Ran Out of Food in the Last Year: Not on file  Transportation Needs:   . Lack of Transportation (Medical): Not on file  . Lack of Transportation (Non-Medical): Not on file  Physical Activity:   . Days of Exercise per Week: Not on file  . Minutes of Exercise per Session: Not on file  Stress:   . Feeling of Stress : Not on file  Social Connections:   . Frequency of Communication with Friends and Family: Not on file  . Frequency of Social Gatherings with Friends and Family: Not on file  . Attends Religious Services: Not on file  . Active Member of Clubs or Organizations: Not on file  . Attends Banker Meetings: Not on file  . Marital Status: Not on file  Intimate Partner Violence:   . Fear of Current or Ex-Partner: Not on file  . Emotionally Abused: Not on file  . Physically Abused: Not on file  . Sexually Abused: Not on file    Outpatient Medications Prior to Visit  Medication Sig Dispense Refill  . valACYclovir (VALTREX) 1000 MG tablet TAKE ONE TABLET BY MOUTH THREE TIMES A  DAY FOR 7 DAYS 21 tablet 0  . Vitamin D, Ergocalciferol, (DRISDOL) 1.25 MG (50000 UNIT) CAPS capsule TAKE ONE CAPSULE BY MOUTH ONCE WEEKLY 4 capsule 4  . atorvastatin (LIPITOR) 20 MG tablet TAKE ONE TABLET BY MOUTH DAILY 90 tablet 1  . levothyroxine (SYNTHROID) 75 MCG tablet TAKE ONE TABLET BY MOUTH EVERY MORNING ONE HOUR BEFORE EATING 90 tablet 3  . estradiol (ESTRACE) 0.5 MG tablet TAKE ONE TABLET BY MOUTH TWICE A DAY (Patient not taking: Reported on 12/20/2019) 180 tablet 0  . fluticasone (FLONASE) 50 MCG/ACT nasal spray Place 2 sprays into both nostrils daily. (Patient not taking: Reported on 12/20/2019) 16 g 6  . gabapentin (NEURONTIN) 300 MG capsule Take 1 capsule (300 mg total) by mouth 3 (three) times daily as needed (pain). (Patient not taking: Reported on 12/20/2019) 90  capsule 2  . methocarbamol (ROBAXIN) 500 MG tablet Take 1 tablet (500 mg total) by mouth every 8 (eight) hours as needed for muscle spasms. (Patient not taking: Reported on 12/20/2019) 60 tablet 0   No facility-administered medications prior to visit.    Allergies  Allergen Reactions  . Levaquin [Levofloxacin] Rash    ROS Review of Systems  Constitutional: Negative.   HENT: Negative.   Eyes: Negative for photophobia and visual disturbance.  Respiratory: Negative for chest tightness and shortness of breath.   Cardiovascular: Positive for chest pain. Negative for palpitations.  Gastrointestinal: Negative.   Endocrine: Negative for polyphagia and polyuria.  Genitourinary: Negative.   Musculoskeletal: Negative for gait problem and joint swelling.      Objective:    Physical Exam Constitutional:      General: She is not in acute distress.    Appearance: Normal appearance. She is not ill-appearing, toxic-appearing or diaphoretic.  HENT:     Head: Normocephalic and atraumatic.     Right Ear: Tympanic membrane, ear canal and external ear normal.     Left Ear: Tympanic membrane, ear canal and external ear normal.     Mouth/Throat:     Mouth: Mucous membranes are dry.     Pharynx: Oropharynx is clear. No oropharyngeal exudate or posterior oropharyngeal erythema.  Eyes:     General: No scleral icterus.       Right eye: No discharge.        Left eye: No discharge.     Extraocular Movements: Extraocular movements intact.     Conjunctiva/sclera: Conjunctivae normal.     Pupils: Pupils are equal, round, and reactive to light.  Neck:     Vascular: No carotid bruit.  Cardiovascular:     Rate and Rhythm: Normal rate and regular rhythm.  Pulmonary:     Effort: Pulmonary effort is normal.     Breath sounds: Normal breath sounds.  Chest:     Breasts:        Right: Normal. No swelling, bleeding, inverted nipple, mass, nipple discharge, skin change or tenderness.        Left: Normal.  No bleeding, inverted nipple, mass, nipple discharge, skin change or tenderness.  Musculoskeletal:     Cervical back: Normal range of motion. No rigidity or tenderness.     Right lower leg: No edema.     Left lower leg: No edema.  Lymphadenopathy:     Cervical: No cervical adenopathy.     Upper Body:     Right upper body: No supraclavicular, axillary or pectoral adenopathy.     Left upper body: No supraclavicular, axillary or pectoral adenopathy.  Skin:    General: Skin is warm and dry.  Neurological:     Mental Status: She is alert.  Psychiatric:        Mood and Affect: Mood normal.        Behavior: Behavior normal.     BP 124/72   Pulse 69   Temp (!) 97.1 F (36.2 C) (Tympanic)   Ht 5\' 4"  (1.626 m)   Wt 172 lb 3.2 oz (78.1 kg)   SpO2 98%   BMI 29.56 kg/m  Wt Readings from Last 3 Encounters:  12/20/19 172 lb 3.2 oz (78.1 kg)  05/27/19 179 lb (81.2 kg)  05/11/19 177 lb 9.6 oz (80.6 kg)     Health Maintenance Due  Topic Date Due  . HIV Screening  Never done  . TETANUS/TDAP  Never done  . MAMMOGRAM  05/23/2019    There are no preventive care reminders to display for this patient.  Lab Results  Component Value Date   TSH 0.39 12/20/2019   Lab Results  Component Value Date   WBC 4.7 12/20/2019   HGB 13.9 12/20/2019   HCT 41.0 12/20/2019   MCV 90.6 12/20/2019   PLT 215.0 12/20/2019   Lab Results  Component Value Date   NA 140 12/20/2019   K 4.2 12/20/2019   CO2 27 12/20/2019   GLUCOSE 77 12/20/2019   BUN 18 12/20/2019   CREATININE 0.81 12/20/2019   BILITOT 0.9 12/20/2019   ALKPHOS 92 12/20/2019   AST 15 12/20/2019   ALT 15 12/20/2019   PROT 6.8 12/20/2019   ALBUMIN 4.5 12/20/2019   CALCIUM 9.4 12/20/2019   GFR 76.24 12/20/2019   Lab Results  Component Value Date   CHOL 162 12/20/2019   Lab Results  Component Value Date   HDL 54.90 12/20/2019   Lab Results  Component Value Date   LDLCALC 91 12/20/2019   Lab Results  Component Value Date    TRIG 80.0 12/20/2019   Lab Results  Component Value Date   CHOLHDL 3 12/20/2019   No results found for: HGBA1C    Assessment & Plan:   Problem List Items Addressed This Visit      Endocrine   Acquired hypothyroidism   Relevant Medications   levothyroxine (SYNTHROID) 75 MCG tablet   Other Relevant Orders   TSH (Completed)     Other   Elevated LDL cholesterol level   Relevant Medications   atorvastatin (LIPITOR) 20 MG tablet   Other Relevant Orders   Comprehensive metabolic panel (Completed)   CBC (Completed)   Lipid panel (Completed)   Vitamin D deficiency - Primary   Relevant Orders   VITAMIN D 25 Hydroxy (Vit-D Deficiency, Fractures) (Completed)   Healthcare maintenance   Relevant Orders   Urinalysis, Routine w reflex microscopic (Completed)   Chest pain   Relevant Orders   Ambulatory referral to Cardiology    Other Visit Diagnoses    Need for influenza vaccination       Relevant Orders   Flu Vaccine QUAD 6+ mos PF IM (Fluarix Quad PF) (Completed)      Meds ordered this encounter  Medications  . levothyroxine (SYNTHROID) 75 MCG tablet    Sig: TAKE ONE TABLET BY MOUTH EVERY MORNING ONE HOUR BEFORE EATING    Dispense:  90 tablet    Refill:  3  . atorvastatin (LIPITOR) 20 MG tablet    Sig: Take 1 tablet (20 mg total) by mouth daily.    Dispense:  90  tablet    Refill:  3    Follow-up: Return in about 6 months (around 06/19/2020).   Continue current medications.  Am concerned about the fleeting nature of her chest pains and her history of elevated ldl cholesterol.  She reminds me that her mom had a heart attack. Mliss Sax, MD

## 2019-12-20 NOTE — Patient Instructions (Signed)

## 2019-12-21 MED ORDER — LEVOTHYROXINE SODIUM 75 MCG PO TABS: ORAL_TABLET | ORAL | 3 refills | Status: DC

## 2019-12-21 MED ORDER — ATORVASTATIN CALCIUM 20 MG PO TABS: 20.0000 mg | ORAL_TABLET | Freq: Every day | ORAL | 3 refills | Status: DC

## 2019-12-21 NOTE — Addendum Note (Signed)
Addended by: Andrez Grime on: 12/21/2019 08:12 AM   Modules accepted: Orders

## 2019-12-22 ENCOUNTER — Encounter: Payer: Self-pay | Admitting: Cardiology

## 2019-12-22 ENCOUNTER — Ambulatory Visit (INDEPENDENT_AMBULATORY_CARE_PROVIDER_SITE_OTHER): Payer: BC Managed Care – PPO | Admitting: Cardiology

## 2019-12-22 ENCOUNTER — Other Ambulatory Visit: Payer: Self-pay

## 2019-12-22 VITALS — BP 122/72 | HR 77 | Ht 64.0 in | Wt 173.0 lb

## 2019-12-22 DIAGNOSIS — R072 Precordial pain: Secondary | ICD-10-CM | POA: Diagnosis not present

## 2019-12-22 NOTE — Progress Notes (Signed)
Brianna Ponder, MD Reason for referral-chest pain  HPI: 64 year old female for evaluation of chest pain at request of Nadene Rubins, MD. Patient typically walks her dog up to an hour at a time and does not have significant dyspnea, exertional chest pain, palpitations, syncope or pedal edema.  She was recently returned from [place] and while driving developed a searing pain in her left breast that lasted 2 to 3 seconds.  It resolved spontaneously.  No associated symptoms and pain did not radiate.  Later she developed a heaviness in her left breast.  She stated that all of her pain was essentially superficial and she felt it was more related to her breast or the nipple and to her heart.  Cardiology now asked to evaluate.  Current Outpatient Medications  Medication Sig Dispense Refill  . atorvastatin (LIPITOR) 20 MG tablet Take 1 tablet (20 mg total) by mouth daily. 90 tablet 3  . levothyroxine (SYNTHROID) 75 MCG tablet TAKE ONE TABLET BY MOUTH EVERY MORNING ONE HOUR BEFORE EATING 90 tablet 3  . methocarbamol (ROBAXIN) 500 MG tablet Take 1 tablet (500 mg total) by mouth every 8 (eight) hours as needed for muscle spasms. 60 tablet 0  . valACYclovir (VALTREX) 1000 MG tablet TAKE ONE TABLET BY MOUTH THREE TIMES A DAY FOR 7 DAYS 21 tablet 0  . Vitamin D, Ergocalciferol, (DRISDOL) 1.25 MG (50000 UNIT) CAPS capsule TAKE ONE CAPSULE BY MOUTH ONCE WEEKLY 4 capsule 4   No current facility-administered medications for this visit.    Allergies  Allergen Reactions  . Levaquin [Levofloxacin] Rash     Past Medical History:  Diagnosis Date  . Hyperlipidemia   . Hypothyroid   . Vitamin D deficiency     Past Surgical History:  Procedure Laterality Date  . ABDOMINAL HYSTERECTOMY    . CESAREAN SECTION      Social History   Socioeconomic History  . Marital status: Married    Spouse name: Not on file  . Number of children: 2  . Years of education: Not on file  . Highest  education level: Not on file  Occupational History  . Not on file  Tobacco Use  . Smoking status: Never Smoker  . Smokeless tobacco: Never Used  Vaping Use  . Vaping Use: Never used  Substance and Sexual Activity  . Alcohol use: Yes    Comment: ocas.  . Drug use: No  . Sexual activity: Not on file  Other Topics Concern  . Not on file  Social History Narrative  . Not on file   Social Determinants of Health   Financial Resource Strain:   . Difficulty of Paying Living Expenses: Not on file  Food Insecurity:   . Worried About Programme researcher, broadcasting/film/video in the Last Year: Not on file  . Ran Out of Food in the Last Year: Not on file  Transportation Needs:   . Lack of Transportation (Medical): Not on file  . Lack of Transportation (Non-Medical): Not on file  Physical Activity:   . Days of Exercise per Week: Not on file  . Minutes of Exercise per Session: Not on file  Stress:   . Feeling of Stress : Not on file  Social Connections:   . Frequency of Communication with Friends and Family: Not on file  . Frequency of Social Gatherings with Friends and Family: Not on file  . Attends Religious Services: Not on file  . Active Member of Clubs or Organizations: Not on file  .  Attends Banker Meetings: Not on file  . Marital Status: Not on file  Intimate Partner Violence:   . Fear of Current or Ex-Partner: Not on file  . Emotionally Abused: Not on file  . Physically Abused: Not on file  . Sexually Abused: Not on file    Family History  Problem Relation Age of Onset  . COPD Mother   . Heart disease Mother     ROS: no fevers or chills, productive cough, hemoptysis, dysphasia, odynophagia, melena, hematochezia, dysuria, hematuria, rash, seizure activity, orthopnea, PND, pedal edema, claudication. Remaining systems are negative.  Physical Exam:   Blood pressure 122/72, pulse 77, height 5\' 4"  (1.626 m), weight 173 lb (78.5 kg).  General:  Well developed/well nourished in  NAD Skin warm/dry Patient not depressed No peripheral clubbing Back-normal HEENT-normal/normal eyelids Neck supple/normal carotid upstroke bilaterally; no bruits; no JVD; no thyromegaly chest - CTA/ normal expansion CV - RRR/normal S1 and S2; no murmurs, rubs or gallops;  PMI nondisplaced Abdomen -NT/ND, no HSM, no mass, + bowel sounds, no bruit 2+ femoral pulses, no bruits Ext-no edema, chords, 2+ DP Neuro-grossly nonfocal  ECG -sinus rhythm at a rate of 77, no ST changes.  Personally reviewed  A/P  1 chest pain-symptoms are very atypical and I think unlikely to be cardiac.  Electrocardiogram shows no ST changes.  Given family history of coronary disease in her mother and hyperlipidemia I did offer a calcium score today for risk stratification.  She will consider this and contact if she would like to proceed.  Note she states she will schedule a mammogram as she felt her pain was in her left breast and superficial.  2 hyperlipidemia-continue statin.  Managed by primary care.  [place], MD

## 2019-12-22 NOTE — Patient Instructions (Signed)

## 2019-12-23 ENCOUNTER — Ambulatory Visit (INDEPENDENT_AMBULATORY_CARE_PROVIDER_SITE_OTHER): Payer: BC Managed Care – PPO

## 2019-12-23 DIAGNOSIS — Z1231 Encounter for screening mammogram for malignant neoplasm of breast: Secondary | ICD-10-CM | POA: Diagnosis not present

## 2020-02-21 ENCOUNTER — Other Ambulatory Visit: Payer: Self-pay | Admitting: Family Medicine

## 2020-02-21 DIAGNOSIS — B029 Zoster without complications: Secondary | ICD-10-CM

## 2020-02-21 DIAGNOSIS — B001 Herpesviral vesicular dermatitis: Secondary | ICD-10-CM

## 2020-04-06 ENCOUNTER — Other Ambulatory Visit: Payer: Self-pay | Admitting: Family Medicine

## 2020-04-06 DIAGNOSIS — E559 Vitamin D deficiency, unspecified: Secondary | ICD-10-CM

## 2020-06-19 ENCOUNTER — Other Ambulatory Visit: Payer: Self-pay

## 2020-06-20 ENCOUNTER — Encounter: Payer: Self-pay | Admitting: Family Medicine

## 2020-06-20 ENCOUNTER — Ambulatory Visit (INDEPENDENT_AMBULATORY_CARE_PROVIDER_SITE_OTHER): Payer: Medicare Other | Admitting: Family Medicine

## 2020-06-20 VITALS — BP 142/78 | HR 76 | Temp 97.1°F | Ht 64.0 in | Wt 172.6 lb

## 2020-06-20 DIAGNOSIS — B001 Herpesviral vesicular dermatitis: Secondary | ICD-10-CM | POA: Diagnosis not present

## 2020-06-20 DIAGNOSIS — B029 Zoster without complications: Secondary | ICD-10-CM

## 2020-06-20 DIAGNOSIS — L409 Psoriasis, unspecified: Secondary | ICD-10-CM

## 2020-06-20 DIAGNOSIS — E039 Hypothyroidism, unspecified: Secondary | ICD-10-CM

## 2020-06-20 DIAGNOSIS — M65331 Trigger finger, right middle finger: Secondary | ICD-10-CM

## 2020-06-20 DIAGNOSIS — E559 Vitamin D deficiency, unspecified: Secondary | ICD-10-CM

## 2020-06-20 DIAGNOSIS — B002 Herpesviral gingivostomatitis and pharyngotonsillitis: Secondary | ICD-10-CM

## 2020-06-20 DIAGNOSIS — E7800 Pure hypercholesterolemia, unspecified: Secondary | ICD-10-CM | POA: Diagnosis not present

## 2020-06-20 MED ORDER — VALACYCLOVIR HCL 1 G PO TABS: 500.0000 mg | ORAL_TABLET | Freq: Two times a day (BID) | ORAL | 3 refills | Status: AC

## 2020-06-20 NOTE — Patient Instructions (Signed)
Trigger Finger  Trigger finger, also called stenosing tenosynovitis,  is a condition that causes a finger to get stuck in a bent position. Each finger has a tendon, which is a tough, cord-like tissue that connects muscle to bone, and each tendon passes through a tunnel of tissue called a tendon sheath. To move your finger, your tendon needs to glide freely through the sheath. Trigger finger happens when the tendon or the sheath thickens, making it difficult to move your finger. Trigger finger can affect any finger or a thumb. It may affect more than one finger. Mild cases may clear up with rest and medicine. Severe cases require more treatment. What are the causes? Trigger finger is caused by a thickened finger tendon or tendon sheath. The cause of this thickening is not known. What increases the risk? The following factors may make you more likely to develop this condition:  Doing activities that require a strong grip.  Having rheumatoid arthritis, gout, or diabetes.  Being 40-60 years old.  Being female. What are the signs or symptoms? Symptoms of this condition include:  Pain when bending or straightening your finger.  Tenderness or swelling where your finger attaches to the palm of your hand.  A lump in the palm of your hand or on the inside of your finger.  Hearing a noise like a pop or a snap when you try to straighten your finger.  Feeling a catching or locking sensation when you try to straighten your finger.  Being unable to straighten your finger. How is this diagnosed? This condition is diagnosed based on your symptoms and a physical exam. How is this treated? This condition may be treated by:  Resting your finger and avoiding activities that make symptoms worse.  Wearing a finger splint to keep your finger extended.  Taking NSAIDs, such as ibuprofen, to relieve pain and swelling.  Doing gentle exercises to stretch the finger as told by your health care  provider.  Having medicine that reduces swelling and inflammation (steroids) injected into the tendon sheath. Injections may need to be repeated.  Having surgery to open the tendon sheath. This may be done if other treatments do not work and you cannot straighten your finger. You may need physical therapy after surgery. Follow these instructions at home: If you have a splint:  Wear the splint as told by your health care provider. Remove it only as told by your health care provider.  Loosen it if your fingers tingle, become numb, or turn cold and blue.  Keep it clean.  If the splint is not waterproof: ? Do not let it get wet. ? Cover it with a watertight covering when you take a bath or shower. Managing pain, stiffness, and swelling If directed, apply heat to the affected area as often as told by your health care provider. Use the heat source that your health care provider recommends, such as a moist heat pack or a heating pad.  Place a towel between your skin and the heat source.  Leave the heat on for 20-30 minutes.  Remove the heat if your skin turns bright red. This is especially important if you are unable to feel pain, heat, or cold. You may have a greater risk of getting burned. If directed, put ice on the painful area. To do this:  If you have a removable splint, remove it as told by your health care provider.  Put ice in a plastic bag.  Place a towel between your skin   and the bag or between your splint and the bag.  Leave the ice on for 20 minutes, 2-3 times a day.      Activity  Rest your finger as told by your health care provider. Avoid activities that make the pain worse.  Return to your normal activities as told by your health care provider. Ask your health care provider what activities are safe for you.  Do exercises as told by your health care provider.  Ask your health care provider when it is safe to drive if you have a splint on your hand. General  instructions  Take over-the-counter and prescription medicines only as told by your health care provider.  Keep all follow-up visits as told by your health care provider. This is important. Contact a health care provider if:  Your symptoms are not improving with home care. Summary  Trigger finger, also called stenosing tenosynovitis, causes your finger to get stuck in a bent position. This can make it difficult and painful to straighten your finger.  This condition develops when a finger tendon or tendon sheath thickens.  Treatment may include resting your finger, wearing a splint, and taking medicines.  In severe cases, surgery to open the tendon sheath may be needed. This information is not intended to replace advice given to you by your health care provider. Make sure you discuss any questions you have with your health care provider. Document Revised: 07/06/2018 Document Reviewed: 07/06/2018 [employer]vier Patient Education  2021 [employer]  

## 2020-06-20 NOTE — Progress Notes (Signed)
Established Patient Office Visit  Subjective:  Patient ID: Brianna Luna, female    DOB: 24-Apr-1955  Age: 65 y.o. MRN: [id number]  CC:  Chief Complaint  Patient presents with  . Follow-up    Routine follow up, patient would like to discuss recent diagnosis of psoriasis of the scalp.     HPI Brianna Luna presents for follow-up of elevated ldl cholesterol, hypothyroidism, vitamin D deficiency, oral herpes, newly diagnosed psoriasis of the scalp and a pain in her right middle finger.  She is right-hand dominant.  No injury.  Denies any numbness or tingling in her hands.  Does not currently use the computer a great deal.  Cholesterol, hypothyroidism and vitamin D deficiency are all well treated with current regimens.  Labs have been stable.  She is taking her thyroid medicine on a fasting stomach 30 minutes before eating.  Past Medical History:  Diagnosis Date  . Hyperlipidemia   . Hypothyroid   . Vitamin D deficiency     Past Surgical History:  Procedure Laterality Date  . ABDOMINAL HYSTERECTOMY    . CESAREAN SECTION      Family History  Problem Relation Age of Onset  . COPD Mother   . Heart disease Mother     Social History   Socioeconomic History  . Marital status: Married    Spouse name: Not on file  . Number of children: 2  . Years of education: Not on file  . Highest education level: Not on file  Occupational History  . Not on file  Tobacco Use  . Smoking status: Never Smoker  . Smokeless tobacco: Never Used  Vaping Use  . Vaping Use: Never used  Substance and Sexual Activity  . Alcohol use: Yes    Comment: ocas.  . Drug use: No  . Sexual activity: Not on file  Other Topics Concern  . Not on file  Social History Narrative  . Not on file   Social Determinants of Health   Financial Resource Strain: Not on file  Food Insecurity: Not on file  Transportation Needs: Not on file  Physical Activity: Not on file  Stress: Not on file  Social  Connections: Not on file  Intimate Partner Violence: Not on file    Outpatient Medications Prior to Visit  Medication Sig Dispense Refill  . atorvastatin (LIPITOR) 20 MG tablet Take 1 tablet (20 mg total) by mouth daily. 90 tablet 3  . clobetasol cream (TEMOVATE) 0.05 % Apply 1 application topically as needed.    Marland Kitchen levothyroxine (SYNTHROID) 75 MCG tablet TAKE ONE TABLET BY MOUTH EVERY MORNING ONE HOUR BEFORE EATING 90 tablet 3  . Vitamin D, Ergocalciferol, (DRISDOL) 1.25 MG (50000 UNIT) CAPS capsule TAKE ONE CAPSULE BY MOUTH ONCE WEEKLY 8 capsule 0  . methocarbamol (ROBAXIN) 500 MG tablet Take 1 tablet (500 mg total) by mouth every 8 (eight) hours as needed for muscle spasms. 60 tablet 0  . valACYclovir (VALTREX) 1000 MG tablet TAKE ONE TABLET BY MOUTH THREE TIMES A DAY FOR 7 DAYS 21 tablet 0   No facility-administered medications prior to visit.    Allergies  Allergen Reactions  . Levaquin [Levofloxacin] Rash    ROS Review of Systems  Constitutional: Negative.   HENT: Negative.   Eyes: Negative for photophobia and visual disturbance.  Respiratory: Negative.   Cardiovascular: Negative.   Gastrointestinal: Negative.   Genitourinary: Negative.   Musculoskeletal: Positive for arthralgias.  Skin: Positive for rash.  Neurological: Negative for speech difficulty  and weakness.  Psychiatric/Behavioral: Negative.       Objective:    Physical Exam Vitals and nursing note reviewed.  Constitutional:      General: She is not in acute distress.    Appearance: Normal appearance. She is not ill-appearing, toxic-appearing or diaphoretic.  HENT:     Head: Normocephalic and atraumatic.     Right Ear: Tympanic membrane, ear canal and external ear normal.     Left Ear: Tympanic membrane, ear canal and external ear normal.     Mouth/Throat:     Mouth: Mucous membranes are moist.     Pharynx: Oropharynx is clear. No oropharyngeal exudate or posterior oropharyngeal erythema.  Eyes:      General: No scleral icterus.       Right eye: No discharge.        Left eye: No discharge.     Extraocular Movements: Extraocular movements intact.     Conjunctiva/sclera: Conjunctivae normal.     Pupils: Pupils are equal, round, and reactive to light.  Cardiovascular:     Rate and Rhythm: Normal rate and regular rhythm.     Pulses:          Dorsalis pedis pulses are 1+ on the right side and 2+ on the left side.       Posterior tibial pulses are 1+ on the right side and 1+ on the left side.     Comments: capillary refill is brisk Pulmonary:     Effort: Pulmonary effort is normal.     Breath sounds: Normal breath sounds.  Abdominal:     General: Bowel sounds are normal.  Musculoskeletal:     Right hand: Tenderness and bony tenderness present. Normal range of motion. Normal strength. Normal pulse.       Arms:     Cervical back: No rigidity or tenderness.  Lymphadenopathy:     Cervical: No cervical adenopathy.  Skin:    General: Skin is warm and dry.       Neurological:     Mental Status: She is alert and oriented to person, place, and time.  Psychiatric:        Mood and Affect: Mood normal.        Behavior: Behavior normal.     BP (!) 142/78   Pulse 76   Temp (!) 97.1 F (36.2 C) (Temporal)   Ht 5\' 4"  (1.626 m)   Wt 172 lb 9.6 oz (78.3 kg)   SpO2 97%   BMI 29.63 kg/m  Wt Readings from Last 3 Encounters:  06/20/20 172 lb 9.6 oz (78.3 kg)  12/22/19 173 lb (78.5 kg)  12/20/19 172 lb 3.2 oz (78.1 kg)     Health Maintenance Due  Topic Date Due  . HIV Screening  Never done  . TETANUS/TDAP  Never done  . PNA vac Low Risk Adult (1 of 2 - PCV13) Never done    There are no preventive care reminders to display for this patient.  Lab Results  Component Value Date   TSH 0.39 12/20/2019   Lab Results  Component Value Date   WBC 4.7 12/20/2019   HGB 13.9 12/20/2019   HCT 41.0 12/20/2019   MCV 90.6 12/20/2019   PLT 215.0 12/20/2019   Lab Results  Component  Value Date   NA 140 12/20/2019   K 4.2 12/20/2019   CO2 27 12/20/2019   GLUCOSE 77 12/20/2019   BUN 18 12/20/2019   CREATININE 0.81 12/20/2019   BILITOT 0.9  12/20/2019   ALKPHOS 92 12/20/2019   AST 15 12/20/2019   ALT 15 12/20/2019   PROT 6.8 12/20/2019   ALBUMIN 4.5 12/20/2019   CALCIUM 9.4 12/20/2019   GFR 76.24 12/20/2019   Lab Results  Component Value Date   CHOL 162 12/20/2019   Lab Results  Component Value Date   HDL 54.90 12/20/2019   Lab Results  Component Value Date   LDLCALC 91 12/20/2019   Lab Results  Component Value Date   TRIG 80.0 12/20/2019   Lab Results  Component Value Date   CHOLHDL 3 12/20/2019   No results found for: HGBA1C    Assessment & Plan:   Problem List Items Addressed This Visit      Digestive   Oral herpes simplex infection   Relevant Medications   valACYclovir (VALTREX) 1000 MG tablet     Endocrine   Acquired hypothyroidism     Musculoskeletal and Integument   Trigger middle finger of right hand - Primary   Psoriasis of scalp     Other   Elevated LDL cholesterol level   Vitamin D deficiency    Other Visit Diagnoses    Herpes zoster without complication       Relevant Medications   valACYclovir (VALTREX) 1000 MG tablet   Fever blister       Relevant Medications   valACYclovir (VALTREX) 1000 MG tablet      Meds ordered this encounter  Medications  . valACYclovir (VALTREX) 1000 MG tablet    Sig: Take 0.5 tablets (500 mg total) by mouth 2 (two) times daily for 3 days.    Dispense:  12 tablet    Refill:  3    Follow-up: Return in about 6 months (around 12/20/2020).  Patient was given information on trigger finger.  Discussed sports medicine referral for injection.  She would like to try exercises first.  She will let me know.  Refilled Valtrex for occasional fever blister.  Return in the fall fasting for blood work and a physical exam.  Mliss Sax, MD

## 2020-12-16 ENCOUNTER — Other Ambulatory Visit: Payer: Self-pay | Admitting: Family Medicine

## 2020-12-16 DIAGNOSIS — E7800 Pure hypercholesterolemia, unspecified: Secondary | ICD-10-CM

## 2020-12-21 ENCOUNTER — Other Ambulatory Visit: Payer: Self-pay

## 2020-12-21 ENCOUNTER — Encounter: Payer: Self-pay | Admitting: Family Medicine

## 2020-12-21 ENCOUNTER — Ambulatory Visit (INDEPENDENT_AMBULATORY_CARE_PROVIDER_SITE_OTHER): Payer: Medicare Other | Admitting: Family Medicine

## 2020-12-21 VITALS — BP 124/78 | HR 74 | Temp 97.0°F | Ht 64.0 in | Wt 174.0 lb

## 2020-12-21 DIAGNOSIS — Z23 Encounter for immunization: Secondary | ICD-10-CM

## 2020-12-21 DIAGNOSIS — E7800 Pure hypercholesterolemia, unspecified: Secondary | ICD-10-CM | POA: Diagnosis not present

## 2020-12-21 DIAGNOSIS — M8580 Other specified disorders of bone density and structure, unspecified site: Secondary | ICD-10-CM | POA: Diagnosis not present

## 2020-12-21 DIAGNOSIS — E559 Vitamin D deficiency, unspecified: Secondary | ICD-10-CM | POA: Diagnosis not present

## 2020-12-21 DIAGNOSIS — Z0000 Encounter for general adult medical examination without abnormal findings: Secondary | ICD-10-CM | POA: Diagnosis not present

## 2020-12-21 DIAGNOSIS — B002 Herpesviral gingivostomatitis and pharyngotonsillitis: Secondary | ICD-10-CM

## 2020-12-21 DIAGNOSIS — E039 Hypothyroidism, unspecified: Secondary | ICD-10-CM | POA: Diagnosis not present

## 2020-12-21 DIAGNOSIS — N281 Cyst of kidney, acquired: Secondary | ICD-10-CM

## 2020-12-21 DIAGNOSIS — Z09 Encounter for follow-up examination after completed treatment for conditions other than malignant neoplasm: Secondary | ICD-10-CM

## 2020-12-21 LAB — COMPREHENSIVE METABOLIC PANEL
ALT: 20 U/L (ref 0–35)
AST: 18 U/L (ref 0–37)
Albumin: 4.5 g/dL (ref 3.5–5.2)
Alkaline Phosphatase: 91 U/L (ref 39–117)
BUN: 20 mg/dL (ref 6–23)
CO2: 28 mEq/L (ref 19–32)
Calcium: 9.4 mg/dL (ref 8.4–10.5)
Chloride: 106 mEq/L (ref 96–112)
Creatinine, Ser: 0.82 mg/dL (ref 0.40–1.20)
GFR: 74.78 mL/min (ref >60.00)
Glucose, Bld: 94 mg/dL (ref 70–99)
Potassium: 4 mEq/L (ref 3.5–5.1)
Sodium: 141 mEq/L (ref 135–145)
Total Bilirubin: 0.9 mg/dL (ref 0.2–1.2)
Total Protein: 6.6 g/dL (ref 6.0–8.3)

## 2020-12-21 LAB — CBC
HCT: 40 % (ref 36.0–46.0)
Hemoglobin: 13.5 g/dL (ref 12.0–15.0)
MCHC: 33.7 g/dL (ref 30.0–36.0)
MCV: 91.5 fl (ref 78.0–100.0)
Platelets: 214 10*3/uL (ref 150.0–400.0)
RBC: 4.37 Mil/uL (ref 3.87–5.11)
RDW: 13 % (ref 11.5–15.5)
WBC: 4.4 10*3/uL (ref 4.0–10.5)

## 2020-12-21 LAB — LIPID PANEL
Cholesterol: 160 mg/dL (ref 0–200)
HDL: 61 mg/dL (ref >39.00)
LDL Cholesterol: 82 mg/dL (ref 0–99)
NonHDL: 98.58
Total CHOL/HDL Ratio: 3
Triglycerides: 82 mg/dL (ref 0.0–149.0)
VLDL: 16.4 mg/dL (ref 0.0–40.0)

## 2020-12-21 LAB — LDL CHOLESTEROL, DIRECT: Direct LDL: 83 mg/dL

## 2020-12-21 LAB — TSH: TSH: 0.72 u[IU]/mL (ref 0.35–5.50)

## 2020-12-21 LAB — VITAMIN D 25 HYDROXY (VIT D DEFICIENCY, FRACTURES): VITD: 27.67 ng/mL — ABNORMAL LOW (ref 30.00–100.00)

## 2020-12-21 NOTE — Addendum Note (Signed)
Addended by: Varney Biles on: 12/21/2020 04:01 PM   Modules accepted: Orders

## 2020-12-21 NOTE — Progress Notes (Addendum)
Established Patient Office Visit  Subjective:  Patient ID: Brianna Luna, female    DOB: 1955-10-10  Age: 65 y.o. MRN: [id number]  CC:  Chief Complaint  Patient presents with   Follow-up    6 month follow up, no concerns. Patient fasting.     HPI Brianna Luna presents for for physical exam and follow-up of hyperlipidemia, hypothyroidism and vitamin D deficiency.  Continues to take atorvastatin 20 mg for controlled hyperlipidemia without issue.  Hypothyroidism is regulated traditionally with 75 mcg of levothyroxine.  Recently restarted high-dose weekly vitamin D.  Past Medical History:  Diagnosis Date   Hyperlipidemia    Hypothyroid    Vitamin D deficiency     Past Surgical History:  Procedure Laterality Date   ABDOMINAL HYSTERECTOMY     CESAREAN SECTION      Family History  Problem Relation Age of Onset   COPD Mother    Heart disease Mother     Social History   Socioeconomic History   Marital status: Married    Spouse name: Not on file   Number of children: 2   Years of education: Not on file   Highest education level: Not on file  Occupational History   Not on file  Tobacco Use   Smoking status: Never   Smokeless tobacco: Never  Vaping Use   Vaping Use: Never used  Substance and Sexual Activity   Alcohol use: Yes    Comment: ocas.   Drug use: No   Sexual activity: Not on file  Other Topics Concern   Not on file  Social History Narrative   Not on file   Social Determinants of Health   Financial Resource Strain: Not on file  Food Insecurity: Not on file  Transportation Needs: Not on file  Physical Activity: Not on file  Stress: Not on file  Social Connections: Not on file  Intimate Partner Violence: Not on file    Outpatient Medications Prior to Visit  Medication Sig Dispense Refill   atorvastatin (LIPITOR) 20 MG tablet TAKE ONE TABLET BY MOUTH DAILY 90 tablet 3   clobetasol cream (TEMOVATE) 0.05 % Apply 1 application topically as  needed.     levothyroxine (SYNTHROID) 75 MCG tablet TAKE ONE TABLET BY MOUTH EVERY MORNING ONE HOUR BEFORE EATING 90 tablet 3   valACYclovir (VALTREX) 1000 MG tablet Take 500 mg by mouth daily.     Vitamin D, Ergocalciferol, (DRISDOL) 1.25 MG (50000 UNIT) CAPS capsule TAKE ONE CAPSULE BY MOUTH ONCE WEEKLY 8 capsule 0   No facility-administered medications prior to visit.    Allergies  Allergen Reactions   Levaquin [Levofloxacin] Rash    ROS Review of Systems  Constitutional:  Negative for chills, diaphoresis, fatigue, fever and unexpected weight change.  HENT: Negative.    Eyes:  Negative for photophobia and visual disturbance.  Respiratory: Negative.    Cardiovascular: Negative.   Gastrointestinal: Negative.   Endocrine: Negative for polyphagia and polyuria.  Genitourinary: Negative.   Musculoskeletal:  Negative for gait problem and joint swelling.  Neurological:  Negative for facial asymmetry, speech difficulty and weakness.  Hematological:  Does not bruise/bleed easily.  Psychiatric/Behavioral: Negative.       Objective:    Physical Exam Vitals and nursing note reviewed.  Constitutional:      General: She is not in acute distress.    Appearance: Normal appearance. She is not ill-appearing, toxic-appearing or diaphoretic.  HENT:     Head: Normocephalic and atraumatic.  Right Ear: Tympanic membrane, ear canal and external ear normal.     Left Ear: Tympanic membrane, ear canal and external ear normal.     Mouth/Throat:     Mouth: Mucous membranes are moist.     Pharynx: Oropharynx is clear. No oropharyngeal exudate or posterior oropharyngeal erythema.  Eyes:     General: No scleral icterus.       Right eye: No discharge.        Left eye: No discharge.     Extraocular Movements: Extraocular movements intact.     Conjunctiva/sclera: Conjunctivae normal.     Pupils: Pupils are equal, round, and reactive to light.  Neck:     Vascular: No carotid bruit.   Cardiovascular:     Rate and Rhythm: Normal rate and regular rhythm.  Pulmonary:     Effort: Pulmonary effort is normal.     Breath sounds: Normal breath sounds.  Abdominal:     General: Bowel sounds are normal.  Musculoskeletal:     Cervical back: No rigidity or tenderness.     Right lower leg: No edema.     Left lower leg: No edema.  Lymphadenopathy:     Cervical: No cervical adenopathy.  Skin:    General: Skin is warm and dry.  Neurological:     Mental Status: She is alert and oriented to person, place, and time.  Psychiatric:        Mood and Affect: Mood normal.        Behavior: Behavior normal.    BP 124/78 (BP Location: Right Arm, Patient Position: Sitting, Cuff Size: Normal)   Pulse 74   Temp (!) 97 F (36.1 C) (Temporal)   Ht 5\' 4"  (1.626 m)   Wt 174 lb (78.9 kg)   SpO2 98%   BMI 29.87 kg/m  Wt Readings from Last 3 Encounters:  12/21/20 174 lb (78.9 kg)  06/20/20 172 lb 9.6 oz (78.3 kg)  12/22/19 173 lb (78.5 kg)     Health Maintenance Due  Topic Date Due   HIV Screening  Never done   TETANUS/TDAP  Never done   Zoster Vaccines- Shingrix (1 of 2) Never done    There are no preventive care reminders to display for this patient.  Lab Results  Component Value Date   TSH 0.72 12/21/2020   Lab Results  Component Value Date   WBC 4.4 12/21/2020   HGB 13.5 12/21/2020   HCT 40.0 12/21/2020   MCV 91.5 12/21/2020   PLT 214.0 12/21/2020   Lab Results  Component Value Date   NA 141 12/21/2020   K 4.0 12/21/2020   CO2 28 12/21/2020   GLUCOSE 94 12/21/2020   BUN 20 12/21/2020   CREATININE 0.82 12/21/2020   BILITOT 0.9 12/21/2020   ALKPHOS 91 12/21/2020   AST 18 12/21/2020   ALT 20 12/21/2020   PROT 6.6 12/21/2020   ALBUMIN 4.5 12/21/2020   CALCIUM 9.4 12/21/2020   GFR 74.78 12/21/2020   Lab Results  Component Value Date   CHOL 160 12/21/2020   Lab Results  Component Value Date   HDL 61.00 12/21/2020   Lab Results  Component Value Date    LDLCALC 82 12/21/2020   Lab Results  Component Value Date   TRIG 82.0 12/21/2020   Lab Results  Component Value Date   CHOLHDL 3 12/21/2020   No results found for: HGBA1C    Assessment & Plan:   Problem List Items Addressed This Visit  Endocrine   Acquired hypothyroidism   Relevant Orders   CBC (Completed)   TSH (Completed)     Musculoskeletal and Integument   Osteopenia   Relevant Orders   DG Bone Density     Other   Elevated LDL cholesterol level   Relevant Orders   Comprehensive metabolic panel (Completed)   LDL cholesterol, direct (Completed)   Lipid panel (Completed)   Vitamin D deficiency   Relevant Medications   Vitamin D, Ergocalciferol, (DRISDOL) 1.25 MG (50000 UNIT) CAPS capsule   Other Relevant Orders   VITAMIN D 25 Hydroxy (Vit-D Deficiency, Fractures) (Completed)   Healthcare maintenance   Relevant Orders   Urinalysis, Routine w reflex microscopic (Completed)   Need for influenza vaccination - Primary   Relevant Orders   Flu Vaccine QUAD High Dose(Fluad) (Completed)   Other Visit Diagnoses     Need for immunization against combination of infectious diseases       Relevant Orders   Pneumococcal conjugate vaccine 20-valent (Prevnar 20) (Completed)   Encounter for follow-up examination after completed treatment for conditions other than malignant neoplasm        Relevant Orders   DG Bone Density       Meds ordered this encounter  Medications   Vitamin D, Ergocalciferol, (DRISDOL) 1.25 MG (50000 UNIT) CAPS capsule    Sig: Take 1 capsule (50,000 Units total) by mouth once a week.    Dispense:  15 capsule    Refill:  3     Follow-up: Return in about 6 months (around 06/21/2021).   High-dose flu vaccine and Prevnar 20 given today.  Up-to-date on other health maintenance.  Given information on the Shingrix vaccine and will obtain that from her pharmacist.  Also given information on health maintenance and disease prevention medications  adjusted pending results of lab. Mliss Sax, MD

## 2020-12-22 ENCOUNTER — Encounter: Payer: Self-pay | Admitting: Family Medicine

## 2020-12-22 DIAGNOSIS — N281 Cyst of kidney, acquired: Secondary | ICD-10-CM | POA: Insufficient documentation

## 2020-12-22 LAB — URINALYSIS, ROUTINE W REFLEX MICROSCOPIC
Bacteria, UA: NONE SEEN /HPF
Bilirubin Urine: NEGATIVE
Glucose, UA: NEGATIVE
Hgb urine dipstick: NEGATIVE
Hyaline Cast: NONE SEEN /LPF
Ketones, ur: NEGATIVE
Nitrite: NEGATIVE
Protein, ur: NEGATIVE
RBC / HPF: NONE SEEN /HPF (ref 0–2)
Specific Gravity, Urine: 1.011 (ref 1.001–1.035)
Squamous Epithelial / HPF: NONE SEEN /HPF (ref <=5)
WBC, UA: NONE SEEN /HPF (ref 0–5)
pH: 6 (ref 5.0–8.0)

## 2020-12-22 LAB — MICROSCOPIC MESSAGE

## 2020-12-22 MED ORDER — VITAMIN D (ERGOCALCIFEROL) 1.25 MG (50000 UNIT) PO CAPS: 50000.0000 [IU] | ORAL_CAPSULE | ORAL | 3 refills | Status: DC

## 2020-12-22 NOTE — Addendum Note (Signed)
Addended by: Andrez Grime on: 12/22/2020 08:21 AM   Modules accepted: Orders

## 2020-12-28 ENCOUNTER — Telehealth (HOSPITAL_BASED_OUTPATIENT_CLINIC_OR_DEPARTMENT_OTHER): Payer: Self-pay

## 2021-01-11 ENCOUNTER — Other Ambulatory Visit (HOSPITAL_BASED_OUTPATIENT_CLINIC_OR_DEPARTMENT_OTHER): Payer: Self-pay | Admitting: Family Medicine

## 2021-01-11 ENCOUNTER — Other Ambulatory Visit: Payer: Self-pay

## 2021-01-11 ENCOUNTER — Ambulatory Visit (HOSPITAL_BASED_OUTPATIENT_CLINIC_OR_DEPARTMENT_OTHER)
Admission: RE | Admit: 2021-01-11 | Discharge: 2021-01-11 | Disposition: A | Discharge status: Home / Self Care | Payer: Medicare Other | Source: Ambulatory Visit | Attending: Family Medicine | Admitting: Family Medicine

## 2021-01-11 DIAGNOSIS — M8580 Other specified disorders of bone density and structure, unspecified site: Secondary | ICD-10-CM | POA: Diagnosis present

## 2021-01-11 DIAGNOSIS — Z09 Encounter for follow-up examination after completed treatment for conditions other than malignant neoplasm: Secondary | ICD-10-CM | POA: Diagnosis present

## 2021-02-04 ENCOUNTER — Other Ambulatory Visit: Payer: Self-pay | Admitting: Family Medicine

## 2021-02-04 DIAGNOSIS — E039 Hypothyroidism, unspecified: Secondary | ICD-10-CM

## 2021-03-01 ENCOUNTER — Ambulatory Visit (INDEPENDENT_AMBULATORY_CARE_PROVIDER_SITE_OTHER): Payer: Medicare Other | Admitting: Family Medicine

## 2021-03-01 ENCOUNTER — Other Ambulatory Visit: Payer: Self-pay

## 2021-03-01 ENCOUNTER — Encounter: Payer: Self-pay | Admitting: Family Medicine

## 2021-03-01 VITALS — BP 134/78 | HR 71 | Temp 96.9°F | Ht 64.0 in | Wt 175.6 lb

## 2021-03-01 DIAGNOSIS — E7800 Pure hypercholesterolemia, unspecified: Secondary | ICD-10-CM | POA: Diagnosis not present

## 2021-03-01 DIAGNOSIS — H9110 Presbycusis, unspecified ear: Secondary | ICD-10-CM | POA: Diagnosis not present

## 2021-03-01 DIAGNOSIS — H6981 Other specified disorders of Eustachian tube, right ear: Secondary | ICD-10-CM

## 2021-03-01 DIAGNOSIS — H6991 Unspecified Eustachian tube disorder, right ear: Secondary | ICD-10-CM | POA: Insufficient documentation

## 2021-03-01 MED ORDER — PREDNISONE 10 MG (21) PO TBPK: ORAL_TABLET | ORAL | 0 refills | Status: DC

## 2021-03-01 NOTE — Progress Notes (Signed)
Established Patient Office Visit  Subjective:  Patient ID: Brianna Luna, female    DOB: Mar 20, 1955  Age: 65 y.o. MRN: [id number]  CC:  Chief Complaint  Patient presents with   Ear Fullness    Ears feeling full little irritating in right ear. Pain will come and go symptoms x 1 month.     HPI Brianna Luna presents for a month history of right ear congestion status post air travel to [place] and back.  Hearing has been somewhat muffled.  Talking close to her ear has been uncomfortable for her.  Longstanding history of tinnitus associated with hearing loss.  She does sneeze 2 or 3 times a day but is unaware of any postnasal drip.  Denies rhinorrhea facial pressure or teeth pain or sore throat.  Both parents smoke.  Mom died of an MI.  Dad passed from a pulmonary embolism.  Her doctor in [place] started her on atorvastatin many years ago.  She has tolerated the medication well.  Past Medical History:  Diagnosis Date   Hyperlipidemia    Hypothyroid    Vitamin D deficiency     Past Surgical History:  Procedure Laterality Date   ABDOMINAL HYSTERECTOMY     CESAREAN SECTION      Family History  Problem Relation Age of Onset   COPD Mother    Heart disease Mother     Social History   Socioeconomic History   Marital status: Married    Spouse name: Not on file   Number of children: 2   Years of education: Not on file   Highest education level: Not on file  Occupational History   Not on file  Tobacco Use   Smoking status: Never   Smokeless tobacco: Never  Vaping Use   Vaping Use: Never used  Substance and Sexual Activity   Alcohol use: Yes    Comment: ocas.   Drug use: No   Sexual activity: Not on file  Other Topics Concern   Not on file  Social History Narrative   Not on file   Social Determinants of Health   Financial Resource Strain: Not on file  Food Insecurity: Not on file  Transportation Needs: Not on file  Physical Activity: Not on file  Stress: Not  on file  Social Connections: Not on file  Intimate Partner Violence: Not on file    Outpatient Medications Prior to Visit  Medication Sig Dispense Refill   atorvastatin (LIPITOR) 20 MG tablet TAKE ONE TABLET BY MOUTH DAILY 90 tablet 3   clobetasol cream (TEMOVATE) 0.05 % Apply 1 application topically as needed.     levothyroxine (SYNTHROID) 75 MCG tablet TAKE ONE TABLET BY MOUTH EVERY MORNING 1 HOUR BEFORE EATING 90 tablet 3   valACYclovir (VALTREX) 1000 MG tablet Take 500 mg by mouth daily.     Vitamin D, Ergocalciferol, (DRISDOL) 1.25 MG (50000 UNIT) CAPS capsule Take 1 capsule (50,000 Units total) by mouth once a week. 15 capsule 3   No facility-administered medications prior to visit.    Allergies  Allergen Reactions   Levaquin [Levofloxacin] Rash    ROS Review of Systems  Constitutional:  Negative for chills, diaphoresis, fatigue, fever and unexpected weight change.  HENT:  Positive for ear pain, hearing loss and sneezing. Negative for ear discharge, postnasal drip, rhinorrhea, sinus pressure and sinus pain.   Eyes:  Negative for photophobia and visual disturbance.  Respiratory: Negative.    Cardiovascular: Negative.   Gastrointestinal: Negative.   Endocrine:  Negative for polyphagia and polyuria.  Neurological:  Negative for speech difficulty, weakness and light-headedness.  Psychiatric/Behavioral: Negative.       Objective:    Physical Exam Vitals and nursing note reviewed.  Constitutional:      General: She is not in acute distress.    Appearance: Normal appearance. She is not ill-appearing, toxic-appearing or diaphoretic.  HENT:     Head: Normocephalic and atraumatic.     Right Ear: No middle ear effusion. Tympanic membrane is retracted. Tympanic membrane is not injected or erythematous.     Left Ear:  No middle ear effusion. Tympanic membrane is retracted. Tympanic membrane is not injected or erythematous.     Mouth/Throat:     Mouth: Mucous membranes are moist.      Pharynx: Oropharynx is clear. No oropharyngeal exudate or posterior oropharyngeal erythema.  Eyes:     General: No scleral icterus.       Right eye: No discharge.        Left eye: No discharge.     Extraocular Movements: Extraocular movements intact.     Conjunctiva/sclera: Conjunctivae normal.     Pupils: Pupils are equal, round, and reactive to light.  Neck:     Vascular: No carotid bruit.  Cardiovascular:     Rate and Rhythm: Normal rate and regular rhythm.  Pulmonary:     Effort: Pulmonary effort is normal.     Breath sounds: Normal breath sounds.  Musculoskeletal:     Cervical back: No rigidity or tenderness.  Lymphadenopathy:     Cervical: No cervical adenopathy.  Skin:    General: Skin is warm and dry.  Neurological:     Mental Status: She is alert and oriented to person, place, and time.  Psychiatric:        Mood and Affect: Mood normal.        Behavior: Behavior normal.    BP 134/78 (BP Location: Right Arm, Patient Position: Sitting, Cuff Size: Large)    Pulse 71    Temp (!) 96.9 F (36.1 C) (Temporal)    Ht 5\' 4"  (1.626 m)    Wt 175 lb 9.6 oz (79.7 kg)    SpO2 99%    BMI 30.14 kg/m  Wt Readings from Last 3 Encounters:  03/01/21 175 lb 9.6 oz (79.7 kg)  12/21/20 174 lb (78.9 kg)  06/20/20 172 lb 9.6 oz (78.3 kg)     Health Maintenance Due  Topic Date Due   TETANUS/TDAP  Never done   Zoster Vaccines- Shingrix (1 of 2) Never done   COVID-19 Vaccine (5 - Booster for Pfizer series) 01/11/2021    There are no preventive care reminders to display for this patient.  Lab Results  Component Value Date   TSH 0.72 12/21/2020   Lab Results  Component Value Date   WBC 4.4 12/21/2020   HGB 13.5 12/21/2020   HCT 40.0 12/21/2020   MCV 91.5 12/21/2020   PLT 214.0 12/21/2020   Lab Results  Component Value Date   NA 141 12/21/2020   K 4.0 12/21/2020   CO2 28 12/21/2020   GLUCOSE 94 12/21/2020   BUN 20 12/21/2020   CREATININE 0.82 12/21/2020   BILITOT 0.9  12/21/2020   ALKPHOS 91 12/21/2020   AST 18 12/21/2020   ALT 20 12/21/2020   PROT 6.6 12/21/2020   ALBUMIN 4.5 12/21/2020   CALCIUM 9.4 12/21/2020   GFR 74.78 12/21/2020   Lab Results  Component Value Date   CHOL 160  12/21/2020   Lab Results  Component Value Date   HDL 61.00 12/21/2020   Lab Results  Component Value Date   LDLCALC 82 12/21/2020   Lab Results  Component Value Date   TRIG 82.0 12/21/2020   Lab Results  Component Value Date   CHOLHDL 3 12/21/2020   No results found for: HGBA1C    Assessment & Plan:   Problem List Items Addressed This Visit       Nervous and Auditory   Presbycusis   Dysfunction of right eustachian tube - Primary   Relevant Medications   predniSONE (STERAPRED UNI-PAK 21 TAB) 10 MG (21) TBPK tablet     Other   Elevated LDL cholesterol level    Meds ordered this encounter  Medications   predniSONE (STERAPRED UNI-PAK 21 TAB) 10 MG (21) TBPK tablet    Sig: Take 6 today, 5 tomorrow, 4 the next day and then 3, 2, 1 and stop    Dispense:  21 tablet    Refill:  0    Follow-up: Return call for referral to audiologist when ready..  Discussed side effects of prednisone taper.  She will perform eustachian tube exercises 3 times daily.  Asked her to consider restarting Flonase.  Given information on eustachian tube dysfunction and hearing loss.  Discussed her low risk of vascular disease per her ASCVD score.  She has a strong family history of vascular disease and would like to continue the atorvastatin.  She has tolerated the medication well over many years.  Libby Maw, MD

## 2021-03-07 ENCOUNTER — Encounter: Payer: Self-pay | Admitting: Family Medicine

## 2021-03-07 DIAGNOSIS — H6981 Other specified disorders of Eustachian tube, right ear: Secondary | ICD-10-CM

## 2021-03-08 MED ORDER — PREDNISONE 10 MG (21) PO TBPK: ORAL_TABLET | ORAL | 0 refills | Status: DC

## 2021-03-16 ENCOUNTER — Encounter: Payer: Self-pay | Admitting: Family Medicine

## 2021-03-16 NOTE — Addendum Note (Signed)
Addended by: Jon Billings on: 03/16/2021 05:09 PM   Modules accepted: Orders

## 2021-03-27 ENCOUNTER — Telehealth: Payer: Self-pay | Admitting: Family Medicine

## 2021-03-29 DIAGNOSIS — H9311 Tinnitus, right ear: Secondary | ICD-10-CM | POA: Insufficient documentation

## 2021-03-29 DIAGNOSIS — H903 Sensorineural hearing loss, bilateral: Secondary | ICD-10-CM | POA: Insufficient documentation

## 2021-06-19 DIAGNOSIS — H81391 Other peripheral vertigo, right ear: Secondary | ICD-10-CM | POA: Diagnosis not present

## 2021-06-19 DIAGNOSIS — H8101 Meniere's disease, right ear: Secondary | ICD-10-CM | POA: Insufficient documentation

## 2021-06-19 DIAGNOSIS — H9311 Tinnitus, right ear: Secondary | ICD-10-CM | POA: Diagnosis not present

## 2021-06-19 DIAGNOSIS — H903 Sensorineural hearing loss, bilateral: Secondary | ICD-10-CM | POA: Diagnosis not present

## 2021-06-21 ENCOUNTER — Encounter: Payer: Self-pay | Admitting: Family Medicine

## 2021-06-21 ENCOUNTER — Ambulatory Visit (INDEPENDENT_AMBULATORY_CARE_PROVIDER_SITE_OTHER): Payer: Medicare Other | Admitting: Family Medicine

## 2021-06-21 VITALS — BP 118/74 | HR 64 | Temp 96.9°F | Ht 64.0 in | Wt 174.0 lb

## 2021-06-21 DIAGNOSIS — E559 Vitamin D deficiency, unspecified: Secondary | ICD-10-CM | POA: Diagnosis not present

## 2021-06-21 DIAGNOSIS — E7800 Pure hypercholesterolemia, unspecified: Secondary | ICD-10-CM

## 2021-06-21 DIAGNOSIS — E039 Hypothyroidism, unspecified: Secondary | ICD-10-CM

## 2021-06-21 LAB — COMPREHENSIVE METABOLIC PANEL
ALT: 20 U/L (ref 0–35)
AST: 19 U/L (ref 0–37)
Albumin: 4.6 g/dL (ref 3.5–5.2)
Alkaline Phosphatase: 90 U/L (ref 39–117)
BUN: 17 mg/dL (ref 6–23)
CO2: 30 mEq/L (ref 19–32)
Calcium: 9.7 mg/dL (ref 8.4–10.5)
Chloride: 104 mEq/L (ref 96–112)
Creatinine, Ser: 0.83 mg/dL (ref 0.40–1.20)
GFR: 73.44 mL/min (ref >60.00)
Glucose, Bld: 89 mg/dL (ref 70–99)
Potassium: 4.3 mEq/L (ref 3.5–5.1)
Sodium: 140 mEq/L (ref 135–145)
Total Bilirubin: 0.8 mg/dL (ref 0.2–1.2)
Total Protein: 6.8 g/dL (ref 6.0–8.3)

## 2021-06-21 LAB — LIPID PANEL
Cholesterol: 157 mg/dL (ref 0–200)
HDL: 50.6 mg/dL (ref >39.00)
LDL Cholesterol: 83 mg/dL (ref 0–99)
NonHDL: 106.3
Total CHOL/HDL Ratio: 3
Triglycerides: 116 mg/dL (ref 0.0–149.0)
VLDL: 23.2 mg/dL (ref 0.0–40.0)

## 2021-06-21 LAB — VITAMIN D 25 HYDROXY (VIT D DEFICIENCY, FRACTURES): VITD: 35.4 ng/mL (ref 30.00–100.00)

## 2021-06-21 LAB — TSH: TSH: 0.73 u[IU]/mL (ref 0.35–5.50)

## 2021-06-21 NOTE — Progress Notes (Signed)
? ?Established Patient Office Visit ? ?Subjective   ?Patient ID: Brianna Luna, female    DOB: January 08, 1956  Age: 66 y.o. MRN: [id number] ? ?Chief Complaint  ?Patient presents with  ? Follow-up  ?  6 month follow up, no concerns. Patient fasting ?  ? ? ?HPI follow-up of elevated cholesterol treated with atorvastatin.  She has a low ASCVD risk score but a strong family history and would like to take a statin.  Continues levothyroxine for hypothyroidism.  Restarted high-dose vitamin D for vitamin D deficiency.  ENT has placed patient on Dyazide. ? ? ? ?Review of Systems  ?Constitutional:  Negative for chills, diaphoresis, malaise/fatigue and weight loss.  ?HENT:  Negative for ear discharge and ear pain.   ?Eyes: Negative.  Negative for blurred vision and double vision.  ?Cardiovascular:  Negative for chest pain.  ?Gastrointestinal:  Negative for abdominal pain.  ?Genitourinary: Negative.   ?Musculoskeletal:  Negative for falls and myalgias.  ?Neurological:  Negative for speech change, loss of consciousness and weakness.  ?Psychiatric/Behavioral: Negative.    ? ?  ?Objective:  ?  ? ?BP 118/74 (BP Location: Right Arm, Patient Position: Sitting, Cuff Size: Normal)   Pulse 64   Temp (!) 96.9 ?F (36.1 ?C) (Temporal)   Ht 5\' 4"  (1.626 m)   Wt 174 lb (78.9 kg)   SpO2 97%   BMI 29.87 kg/m?  ? ? ?Physical Exam ?Constitutional:   ?   General: She is not in acute distress. ?   Appearance: Normal appearance. She is not ill-appearing, toxic-appearing or diaphoretic.  ?HENT:  ?   Head: Normocephalic and atraumatic.  ?   Right Ear: External ear normal. Tympanic membrane is retracted. Tympanic membrane is not injected or erythematous.  ?   Left Ear: External ear normal. Tympanic membrane is not injected, erythematous or retracted.  ?   Mouth/Throat:  ?   Mouth: Mucous membranes are moist.  ?   Pharynx: Oropharynx is clear. No oropharyngeal exudate or posterior oropharyngeal erythema.  ?Eyes:  ?   General: No scleral icterus.     ?   Right eye: No discharge.     ?   Left eye: No discharge.  ?   Extraocular Movements: Extraocular movements intact.  ?   Conjunctiva/sclera: Conjunctivae normal.  ?   Pupils: Pupils are equal, round, and reactive to light.  ?Cardiovascular:  ?   Rate and Rhythm: Normal rate and regular rhythm.  ?Pulmonary:  ?   Effort: Pulmonary effort is normal. No respiratory distress.  ?   Breath sounds: Normal breath sounds.  ?Abdominal:  ?   General: Bowel sounds are normal.  ?   Tenderness: There is no abdominal tenderness. There is no guarding.  ?Musculoskeletal:  ?   Cervical back: No rigidity or tenderness.  ?Skin: ?   General: Skin is warm and dry.  ?Neurological:  ?   Mental Status: She is alert and oriented to person, place, and time.  ?Psychiatric:     ?   Mood and Affect: Mood normal.     ?   Behavior: Behavior normal.  ? ? ? ?No results found for any visits on 06/21/21. ? ? ? ?The 10-year ASCVD risk score (Arnett DK, et al., 2019) is: 4.6% ? ?  ?Assessment & Plan:  ? ?Problem List Items Addressed This Visit   ? ?  ? Endocrine  ? Hypothyroidism  ? Relevant Orders  ? TSH  ?  ? Other  ? Elevated LDL  cholesterol level - Primary  ? Relevant Orders  ? Comprehensive metabolic panel  ? Lipid panel  ? Vitamin D deficiency  ? Relevant Orders  ? VITAMIN D 25 Hydroxy (Vit-D Deficiency, Fractures)  ? ? ?Return in about 6 months (around 12/21/2021).  ?Continue current medicines as noted above.  We will adjust according to lab results.  She has an MRI scheduled in [place] to assess her chronic right-sided fullness in her head that she is experiencing.  She will start Dyazide per ENT.  Advised her to look out for lightheadedness dehydration. ? ?Mliss Sax, MD ? ?

## 2021-07-10 DIAGNOSIS — H9190 Unspecified hearing loss, unspecified ear: Secondary | ICD-10-CM | POA: Diagnosis not present

## 2021-07-10 DIAGNOSIS — H8101 Meniere's disease, right ear: Secondary | ICD-10-CM | POA: Diagnosis not present

## 2021-07-10 DIAGNOSIS — H903 Sensorineural hearing loss, bilateral: Secondary | ICD-10-CM | POA: Diagnosis not present

## 2021-07-10 DIAGNOSIS — H9319 Tinnitus, unspecified ear: Secondary | ICD-10-CM | POA: Diagnosis not present

## 2021-07-10 DIAGNOSIS — H81391 Other peripheral vertigo, right ear: Secondary | ICD-10-CM | POA: Diagnosis not present

## 2021-07-10 DIAGNOSIS — J320 Chronic maxillary sinusitis: Secondary | ICD-10-CM | POA: Diagnosis not present

## 2021-07-10 DIAGNOSIS — R42 Dizziness and giddiness: Secondary | ICD-10-CM | POA: Diagnosis not present

## 2021-07-18 ENCOUNTER — Ambulatory Visit (INDEPENDENT_AMBULATORY_CARE_PROVIDER_SITE_OTHER): Payer: Medicare Other

## 2021-07-18 DIAGNOSIS — Z1231 Encounter for screening mammogram for malignant neoplasm of breast: Secondary | ICD-10-CM

## 2021-07-18 DIAGNOSIS — Z0000 Encounter for general adult medical examination without abnormal findings: Secondary | ICD-10-CM

## 2021-07-18 NOTE — Patient Instructions (Signed)
Brianna Luna , ?Thank you for taking time to come for your Medicare Wellness Visit. I appreciate your ongoing commitment to your health goals. Please review the following plan we discussed and let me know if I can assist you in the future.  ? ?Screening recommendations/referrals: ?Colonoscopy: 05/27/2016 ?Mammogram: referral 07/15/2021 ?Bone Density: 01/11/2021 ?Recommended yearly ophthalmology/optometry visit for glaucoma screening and checkup ?Recommended yearly dental visit for hygiene and checkup ? ?Vaccinations: ?Influenza vaccine: completed  ?Pneumococcal vaccine: completed  ?Tdap vaccine: 03/29/2021 ?Shingles vaccine: will consider    ? ?Advanced directives: yes  ? ?Conditions/risks identified: none  ? ?Next appointment: none  ? ? ?Preventive Care 9 Years and Older, Female ?Preventive care refers to lifestyle choices and visits with your health care provider that can promote health and wellness. ?What does preventive care include? ?A yearly physical exam. This is also called an annual well check. ?Dental exams once or twice a year. ?Routine eye exams. Ask your health care provider how often you should have your eyes checked. ?Personal lifestyle choices, including: ?Daily care of your teeth and gums. ?Regular physical activity. ?Eating a healthy diet. ?Avoiding tobacco and drug use. ?Limiting alcohol use. ?Practicing safe sex. ?Taking low-dose aspirin every day. ?Taking vitamin and mineral supplements as recommended by your health care provider. ?What happens during an annual well check? ?The services and screenings done by your health care provider during your annual well check will depend on your age, overall health, lifestyle risk factors, and family history of disease. ?Counseling  ?Your health care provider may ask you questions about your: ?Alcohol use. ?Tobacco use. ?Drug use. ?Emotional well-being. ?Home and relationship well-being. ?Sexual activity. ?Eating habits. ?History of falls. ?Memory and  ability to understand (cognition). ?Work and work Astronomer. ?Reproductive health. ?Screening  ?You may have the following tests or measurements: ?Height, weight, and BMI. ?Blood pressure. ?Lipid and cholesterol levels. These may be checked every 5 years, or more frequently if you are over 59 years old. ?Skin check. ?Lung cancer screening. You may have this screening every year starting at age 53 if you have a 30-pack-year history of smoking and currently smoke or have quit within the past 15 years. ?Fecal occult blood test (FOBT) of the stool. You may have this test every year starting at age 62. ?Flexible sigmoidoscopy or colonoscopy. You may have a sigmoidoscopy every 5 years or a colonoscopy every 10 years starting at age 51. ?Hepatitis C blood test. ?Hepatitis B blood test. ?Sexually transmitted disease (STD) testing. ?Diabetes screening. This is done by checking your blood sugar (glucose) after you have not eaten for a while (fasting). You may have this done every 1-3 years. ?Bone density scan. This is done to screen for osteoporosis. You may have this done starting at age 36. ?Mammogram. This may be done every 1-2 years. Talk to your health care provider about how often you should have regular mammograms. ?Talk with your health care provider about your test results, treatment options, and if necessary, the need for more tests. ?Vaccines  ?Your health care provider may recommend certain vaccines, such as: ?Influenza vaccine. This is recommended every year. ?Tetanus, diphtheria, and acellular pertussis (Tdap, Td) vaccine. You may need a Td booster every 10 years. ?Zoster vaccine. You may need this after age 73. ?Pneumococcal 13-valent conjugate (PCV13) vaccine. One dose is recommended after age 53. ?Pneumococcal polysaccharide (PPSV23) vaccine. One dose is recommended after age 48. ?Talk to your health care provider about which screenings and vaccines you need and how  often you need them. ?This information is  not intended to replace advice given to you by your health care provider. Make sure you discuss any questions you have with your health care provider. ?Document Released: 03/17/2015 Document Revised: 11/08/2015 Document Reviewed: 12/20/2014 ?Elsevier Interactive Patient Education ? 2017 [place]. ? ?Fall Prevention in the Home ?Falls can cause injuries. They can happen to people of all ages. There are many things you can do to make your home safe and to help prevent falls. ?What can I do on the outside of my home? ?Regularly fix the edges of walkways and driveways and fix any cracks. ?Remove anything that might make you trip as you walk through a door, such as a raised step or threshold. ?Trim any bushes or trees on the path to your home. ?Use bright outdoor lighting. ?Clear any walking paths of anything that might make someone trip, such as rocks or tools. ?Regularly check to see if handrails are loose or broken. Make sure that both sides of any steps have handrails. ?Any raised decks and porches should have guardrails on the edges. ?Have any leaves, snow, or ice cleared regularly. ?Use sand or salt on walking paths during winter. ?Clean up any spills in your garage right away. This includes oil or grease spills. ?What can I do in the bathroom? ?Use night lights. ?Install grab bars by the toilet and in the tub and shower. Do not use towel bars as grab bars. ?Use non-skid mats or decals in the tub or shower. ?If you need to sit down in the shower, use a plastic, non-slip stool. ?Keep the floor dry. Clean up any water that spills on the floor as soon as it happens. ?Remove soap buildup in the tub or shower regularly. ?Attach bath mats securely with double-sided non-slip rug tape. ?Do not have throw rugs and other things on the floor that can make you trip. ?What can I do in the bedroom? ?Use night lights. ?Make sure that you have a light by your bed that is easy to reach. ?Do not use any sheets or blankets that  are too big for your bed. They should not hang down onto the floor. ?Have a firm chair that has side arms. You can use this for support while you get dressed. ?Do not have throw rugs and other things on the floor that can make you trip. ?What can I do in the kitchen? ?Clean up any spills right away. ?Avoid walking on wet floors. ?Keep items that you use a lot in easy-to-reach places. ?If you need to reach something above you, use a strong step stool that has a grab bar. ?Keep electrical cords out of the way. ?Do not use floor polish or wax that makes floors slippery. If you must use wax, use non-skid floor wax. ?Do not have throw rugs and other things on the floor that can make you trip. ?What can I do with my stairs? ?Do not leave any items on the stairs. ?Make sure that there are handrails on both sides of the stairs and use them. Fix handrails that are broken or loose. Make sure that handrails are as long as the stairways. ?Check any carpeting to make sure that it is firmly attached to the stairs. Fix any carpet that is loose or worn. ?Avoid having throw rugs at the top or bottom of the stairs. If you do have throw rugs, attach them to the floor with carpet tape. ?Make sure that you have a  light switch at the top of the stairs and the bottom of the stairs. If you do not have them, ask someone to add them for you. ?What else can I do to help prevent falls? ?Wear shoes that: ?Do not have high heels. ?Have rubber bottoms. ?Are comfortable and fit you well. ?Are closed at the toe. Do not wear sandals. ?If you use a stepladder: ?Make sure that it is fully opened. Do not climb a closed stepladder. ?Make sure that both sides of the stepladder are locked into place. ?Ask someone to hold it for you, if possible. ?Clearly mark and make sure that you can see: ?Any grab bars or handrails. ?First and last steps. ?Where the edge of each step is. ?Use tools that help you move around (mobility aids) if they are needed. These  include: ?Canes. ?Walkers. ?Scooters. ?Crutches. ?Turn on the lights when you go into a dark area. Replace any light bulbs as soon as they burn out. ?Set up your furniture so you have a clear path. Avoid mov

## 2021-07-18 NOTE — Progress Notes (Signed)
Subjective:   Brianna Luna is a 66 y.o. female who presents for an Initial Medicare Annual Wellness Visit.  Virtual Visit via Video Note  I connected with Brianna LoreAlisann Groninger by a video enabled telemedicine application and verified that I am speaking with the correct person using two identifiers.  Location: Patient: Home Provider: Office Persons participating in the virtual visit: patient, provider   I discussed the limitations of evaluation and management by telemedicine and the availability of in person appointments. The patient expressed understanding and agreed to proceed.     Brianna RiegerLaura Onur Mori,LPN  Review of Systems     Cardiac Risk Factors include: advanced age (>4255men, 8>65 women);hypertension     Objective:    Today's Vitals   There is no height or weight on file to calculate BMI.     07/18/2021    2:31 PM 06/08/2019   12:06 PM  Advanced Directives  Does Patient Have a Medical Advance Directive? Yes Yes  Type of Estate agentAdvance Directive Healthcare Power of [place];Living will Healthcare Power of [place];Living will  Copy of Healthcare Power of Attorney in Chart? No - copy requested No - copy requested    Current Medications (verified) Outpatient Encounter Medications as of 07/18/2021  Medication Sig   atorvastatin (LIPITOR) 20 MG tablet TAKE ONE TABLET BY MOUTH DAILY   clobetasol cream (TEMOVATE) 0.05 % Apply 1 application topically as needed.   levothyroxine (SYNTHROID) 75 MCG tablet TAKE ONE TABLET BY MOUTH EVERY MORNING 1 HOUR BEFORE EATING   triamterene-hydrochlorothiazide (DYAZIDE) 37.5-25 MG capsule Take 1 capsule by mouth every morning.   valACYclovir (VALTREX) 1000 MG tablet Take 500 mg by mouth daily.   Vitamin D, Ergocalciferol, (DRISDOL) 1.25 MG (50000 UNIT) CAPS capsule Take 1 capsule (50,000 Units total) by mouth once a week.   No facility-administered encounter medications on file as of 07/18/2021.    Allergies (verified) Levaquin [levofloxacin]    History: Past Medical History:  Diagnosis Date   Hyperlipidemia    Hypothyroid    Vitamin D deficiency    Past Surgical History:  Procedure Laterality Date   ABDOMINAL HYSTERECTOMY     CESAREAN SECTION     Family History  Problem Relation Age of Onset   COPD Mother    Heart disease Mother    Social History   Socioeconomic History   Marital status: Married    Spouse name: Not on file   Number of children: 2   Years of education: Not on file   Highest education level: Not on file  Occupational History   Not on file  Tobacco Use   Smoking status: Never   Smokeless tobacco: Never  Vaping Use   Vaping Use: Never used  Substance and Sexual Activity   Alcohol use: Yes    Comment: ocas.   Drug use: No   Sexual activity: Not on file  Other Topics Concern   Not on file  Social History Narrative   Not on file   Social Determinants of Health   Financial Resource Strain: Low Risk    Difficulty of Paying Living Expenses: Not hard at all  Food Insecurity: No Food Insecurity   Worried About Running Out of Food in the Last Year: Never true   Ran Out of Food in the Last Year: Never true  Transportation Needs: No Transportation Needs   Lack of Transportation (Medical): No   Lack of Transportation (Non-Medical): No  Physical Activity: Sufficiently Active   Days of Exercise per Week: 5 days  Minutes of Exercise per Session: 30 min  Stress: No Stress Concern Present   Feeling of Stress : Not at all  Social Connections: Moderately Isolated   Frequency of Communication with Friends and Family: Three times a week   Frequency of Social Gatherings with Friends and Family: Three times a week   Attends Religious Services: Never   Active Member of Clubs or Organizations: No   Attends Engineer, structural: Never   Marital Status: Married    Tobacco Counseling Counseling given: Not Answered   Clinical Intake:  Pre-visit preparation completed: Yes  Pain :  No/denies pain     Nutritional Risks: None Diabetes: No  How often do you need to have someone help you when you read instructions, pamphlets, or other written materials from your doctor or pharmacy?: 1 - Never What is the last grade level you completed in school?: college  Diabetic?no   Interpreter Needed?: No  Information entered by :: L.Aser Nylund,LPN   Activities of Daily Living    07/18/2021    2:36 PM  In your present state of health, do you have any difficulty performing the following activities:  Hearing? 0  Vision? 0  Difficulty concentrating or making decisions? 0  Walking or climbing stairs? 0  Dressing or bathing? 0  Doing errands, shopping? 0  Preparing Food and eating ? N  Using the Toilet? N  In the past six months, have you accidently leaked urine? N  Do you have problems with loss of bowel control? N  Managing your Medications? N  Managing your Finances? N  Housekeeping or managing your Housekeeping? N    Patient Care Team: Mliss Sax, MD as PCP - General (Family Medicine)  Indicate any recent Medical Services you may have received from other than [facility] providers in the past year (date may be approximate).     Assessment:   This is a routine wellness examination for [name].  Hearing/Vision screen Vision Screening - Comments:: Annual eye exams wears glasses  Dietary issues and exercise activities discussed: Current Exercise Habits: Home exercise routine, Type of exercise: walking, Time (Minutes): 45, Frequency (Times/Week): 5, Weekly Exercise (Minutes/Week): 225, Intensity: Mild, Exercise limited by: None identified   Goals Addressed   None    Depression Screen    07/18/2021    2:32 PM 07/18/2021    2:30 PM 06/21/2021   11:07 AM 12/21/2020   11:09 AM 06/20/2020   11:21 AM 05/11/2019    1:45 PM 05/13/2017   10:07 AM  PHQ 2/9 Scores  PHQ - 2 Score 0 0 0 0 0 0 0    Fall Risk    07/18/2021    2:31 PM 06/21/2021   11:07 AM 12/21/2020    11:09 AM 06/20/2020   11:21 AM  Fall Risk   Falls in the past year? 0 0 0 0  Number falls in past yr: 0 0 0   Injury with Fall? 0     Follow up Falls evaluation completed       FALL RISK PREVENTION PERTAINING TO THE HOME:  Any stairs in or around the home? Yes  If so, are there any without handrails? Yes  Home free of loose throw rugs in walkways, pet beds, electrical cords, etc? Yes  Adequate lighting in your home to reduce risk of falls? Yes   ASSISTIVE DEVICES UTILIZED TO PREVENT FALLS:  Life alert? No  Use of a cane, walker or w/c? No  Grab bars in the  bathroom? No  Shower chair or bench in shower? No  Elevated toilet seat or a handicapped toilet? No     Cognitive Function:    Normal cognitive status assessed by telephone conversation  by this Nurse Health Advisor. No abnormalities found.      Immunizations Immunization History  Administered Date(s) Administered   Fluad Quad(high Dose 65+) 12/21/2020   Influenza,inj,Quad PF,6+ Mos 12/20/2019   Influenza-Unspecified 01/22/2016, 01/21/2018   PFIZER(Purple Top)SARS-COV-2 Vaccination 05/14/2019, 06/07/2019, 01/12/2020, 11/16/2020   PNEUMOCOCCAL CONJUGATE-20 12/21/2020   Tdap 03/29/2021   Zoster, Live 01/22/2016    TDAP status: Up to date  Flu Vaccine status: Up to date  Pneumococcal vaccine status: Up to date  Covid-19 vaccine status: Completed vaccines  Qualifies for Shingles Vaccine? Yes   Zostavax completed No   Shingrix Completed?: No.    Education has been provided regarding the importance of this vaccine. Patient has been advised to call insurance company to determine out of pocket expense if they have not yet received this vaccine. Advised may also receive vaccine at local pharmacy or Health Dept. Verbalized acceptance and understanding.  Screening Tests Health Maintenance  Topic Date Due   MAMMOGRAM  12/22/2020   Zoster Vaccines- Shingrix (1 of 2) 09/20/2021 (Originally 01/05/2005)   INFLUENZA  VACCINE  10/02/2021   COLONOSCOPY (Pts 45-26yrs Insurance coverage will need to be confirmed)  05/28/2026   TETANUS/TDAP  03/30/2031   Pneumonia Vaccine 54+ Years old  Completed   DEXA SCAN  Completed   Hepatitis C Screening  Completed   HPV VACCINES  Aged Out   COVID-19 Vaccine  Discontinued    Health Maintenance  Health Maintenance Due  Topic Date Due   MAMMOGRAM  12/22/2020    Colorectal cancer screening: Type of screening: Colonoscopy. Completed 05/27/2016. Repeat every 10 years  Mammogram status: Ordered 07/18/2021. Pt provided with contact info and advised to call to schedule appt.   Bone Density status: Completed 01/11/2021. Results reflect: Bone density results: OSTEOPENIA. Repeat every 5 years.  Lung Cancer Screening: (Low Dose CT Chest recommended if Age 17-80 years, 30 pack-year currently smoking OR have quit w/in 15years.) does not qualify.   Lung Cancer Screening Referral: n/a  Additional Screening:  Hepatitis C Screening: does not qualify;   Vision Screening: Recommended annual ophthalmology exams for early detection of glaucoma and other disorders of the eye. Is the patient up to date with their annual eye exam?  Yes  Who is the provider or what is the name of the office in which the patient attends annual eye exams? Unknown  If pt is not established with a provider, would they like to be referred to a provider to establish care? No .   Dental Screening: Recommended annual dental exams for proper oral hygiene  Community Resource Referral / Chronic Care Management: CRR required this visit?  No   CCM required this visit?  No      Plan:     I have personally reviewed and noted the following in the patient's chart:   Medical and social history Use of alcohol, tobacco or illicit drugs  Current medications and supplements including opioid prescriptions. Patient is not currently taking opioid prescriptions. Functional ability and status Nutritional  status Physical activity Advanced directives List of other physicians Hospitalizations, surgeries, and ER visits in previous 12 months Vitals Screenings to include cognitive, depression, and falls Referrals and appointments  In addition, I have reviewed and discussed with patient certain preventive protocols, quality metrics, and best  practice recommendations. A written personalized care plan for preventive services as well as general preventive health recommendations were provided to patient.     March Rummage, LPN   [id number]   Nurse Notes: none

## 2021-08-24 ENCOUNTER — Ambulatory Visit
Admission: RE | Admit: 2021-08-24 | Discharge: 2021-08-24 | Disposition: A | Discharge status: Home / Self Care | Payer: Medicare Other | Source: Ambulatory Visit | Attending: Family Medicine | Admitting: Family Medicine

## 2021-08-24 DIAGNOSIS — Z1231 Encounter for screening mammogram for malignant neoplasm of breast: Secondary | ICD-10-CM | POA: Diagnosis not present

## 2021-10-19 DIAGNOSIS — H9313 Tinnitus, bilateral: Secondary | ICD-10-CM | POA: Diagnosis not present

## 2021-10-19 DIAGNOSIS — H8101 Meniere's disease, right ear: Secondary | ICD-10-CM | POA: Diagnosis not present

## 2021-10-19 DIAGNOSIS — H903 Sensorineural hearing loss, bilateral: Secondary | ICD-10-CM | POA: Diagnosis not present

## 2021-10-29 NOTE — Telephone Encounter (Signed)
Na

## 2021-12-12 ENCOUNTER — Other Ambulatory Visit: Payer: Self-pay | Admitting: Family Medicine

## 2021-12-12 DIAGNOSIS — E7800 Pure hypercholesterolemia, unspecified: Secondary | ICD-10-CM

## 2021-12-12 DIAGNOSIS — Z23 Encounter for immunization: Secondary | ICD-10-CM | POA: Diagnosis not present

## 2021-12-20 DIAGNOSIS — H9313 Tinnitus, bilateral: Secondary | ICD-10-CM | POA: Diagnosis not present

## 2021-12-20 DIAGNOSIS — H903 Sensorineural hearing loss, bilateral: Secondary | ICD-10-CM | POA: Diagnosis not present

## 2021-12-21 ENCOUNTER — Encounter: Payer: Self-pay | Admitting: Family Medicine

## 2021-12-21 ENCOUNTER — Ambulatory Visit (INDEPENDENT_AMBULATORY_CARE_PROVIDER_SITE_OTHER): Payer: Medicare Other | Admitting: Family Medicine

## 2021-12-21 VITALS — BP 110/68 | HR 66 | Temp 97.1°F | Ht 64.0 in | Wt 171.8 lb

## 2021-12-21 DIAGNOSIS — H6991 Unspecified Eustachian tube disorder, right ear: Secondary | ICD-10-CM

## 2021-12-21 DIAGNOSIS — E039 Hypothyroidism, unspecified: Secondary | ICD-10-CM

## 2021-12-21 DIAGNOSIS — H8101 Meniere's disease, right ear: Secondary | ICD-10-CM | POA: Diagnosis not present

## 2021-12-21 DIAGNOSIS — E7800 Pure hypercholesterolemia, unspecified: Secondary | ICD-10-CM

## 2021-12-21 DIAGNOSIS — E559 Vitamin D deficiency, unspecified: Secondary | ICD-10-CM

## 2021-12-21 LAB — COMPREHENSIVE METABOLIC PANEL
ALT: 26 U/L (ref 0–35)
AST: 25 U/L (ref 0–37)
Albumin: 4.8 g/dL (ref 3.5–5.2)
Alkaline Phosphatase: 100 U/L (ref 39–117)
BUN: 17 mg/dL (ref 6–23)
CO2: 25 mEq/L (ref 19–32)
Calcium: 10 mg/dL (ref 8.4–10.5)
Chloride: 101 mEq/L (ref 96–112)
Creatinine, Ser: 0.87 mg/dL (ref 0.40–1.20)
GFR: 69.17 mL/min (ref >60.00)
Glucose, Bld: 91 mg/dL (ref 70–99)
Potassium: 3.4 mEq/L — ABNORMAL LOW (ref 3.5–5.1)
Sodium: 137 mEq/L (ref 135–145)
Total Bilirubin: 1.1 mg/dL (ref 0.2–1.2)
Total Protein: 7.3 g/dL (ref 6.0–8.3)

## 2021-12-21 LAB — LIPID PANEL
Cholesterol: 153 mg/dL (ref 0–200)
HDL: 59.2 mg/dL (ref >39.00)
LDL Cholesterol: 81 mg/dL (ref 0–99)
NonHDL: 93.7
Total CHOL/HDL Ratio: 3
Triglycerides: 65 mg/dL (ref 0.0–149.0)
VLDL: 13 mg/dL (ref 0.0–40.0)

## 2021-12-21 LAB — VITAMIN D 25 HYDROXY (VIT D DEFICIENCY, FRACTURES): VITD: 36.12 ng/mL (ref 30.00–100.00)

## 2021-12-21 LAB — CBC
HCT: 42.1 % (ref 36.0–46.0)
Hemoglobin: 14.3 g/dL (ref 12.0–15.0)
MCHC: 34.1 g/dL (ref 30.0–36.0)
MCV: 89.9 fl (ref 78.0–100.0)
Platelets: 243 10*3/uL (ref 150.0–400.0)
RBC: 4.68 Mil/uL (ref 3.87–5.11)
RDW: 13.1 % (ref 11.5–15.5)
WBC: 5.4 10*3/uL (ref 4.0–10.5)

## 2021-12-21 LAB — URINALYSIS, ROUTINE W REFLEX MICROSCOPIC
Bilirubin Urine: NEGATIVE
Hgb urine dipstick: NEGATIVE
Ketones, ur: NEGATIVE
Nitrite: NEGATIVE
RBC / HPF: NONE SEEN (ref 0–?)
Specific Gravity, Urine: <=1.005 — AB (ref 1.000–1.030)
Total Protein, Urine: NEGATIVE
Urine Glucose: NEGATIVE
Urobilinogen, UA: 0.2 (ref 0.0–1.0)
pH: 6.5 (ref 5.0–8.0)

## 2021-12-21 LAB — TSH: TSH: 0.61 u[IU]/mL (ref 0.35–5.50)

## 2021-12-21 NOTE — Progress Notes (Signed)
Established Patient Office Visit  Subjective   Patient ID: Brianna Luna, female    DOB: May 19, 1955  Age: 66 y.o. MRN: [id number]  Chief Complaint  Patient presents with   Follow-up    6 month follow up, no concerns.     HPI follow-up of elevated ldl cholesterol, hypothyroidism and MND deficiency.  Recently started on Dyazide for Mnire's disease.  Her eustachian tube dysfunction has resolved.  She is exercising by walking her dog daily for an hour.  She has regular dental care.  Continues atorvastatin 20 mg for elevated cholesterol, levothyroxine 75 mics grams for hypothyroidism and high-dose weekly vitamin D for vitamin D deficiency    Review of Systems  Constitutional: Negative.   HENT: Negative.    Eyes:  Negative for blurred vision, discharge and redness.  Respiratory: Negative.    Cardiovascular: Negative.   Gastrointestinal:  Negative for abdominal pain.  Genitourinary: Negative.   Musculoskeletal: Negative.  Negative for myalgias.  Skin:  Negative for rash.  Neurological:  Negative for tingling, loss of consciousness and weakness.  Endo/Heme/Allergies:  Negative for polydipsia.      Objective:     BP 110/68 (BP Location: Right Arm, Patient Position: Sitting, Cuff Size: Normal)   Pulse 66   Temp (!) 97.1 F (36.2 C) (Temporal)   Ht 5\' 4"  (1.626 m)   Wt 171 lb 12.8 oz (77.9 kg)   SpO2 95%   BMI 29.49 kg/m  BP Readings from Last 3 Encounters:  12/21/21 110/68  06/21/21 118/74  03/01/21 134/78   Wt Readings from Last 3 Encounters:  12/21/21 171 lb 12.8 oz (77.9 kg)  06/21/21 174 lb (78.9 kg)  03/01/21 175 lb 9.6 oz (79.7 kg)      Physical Exam Constitutional:      General: She is not in acute distress.    Appearance: Normal appearance. She is not ill-appearing, toxic-appearing or diaphoretic.  HENT:     Head: Normocephalic and atraumatic.     Right Ear: Tympanic membrane, ear canal and external ear normal.     Left Ear: Tympanic membrane, ear  canal and external ear normal.     Mouth/Throat:     Mouth: Mucous membranes are moist.     Pharynx: Oropharynx is clear. No oropharyngeal exudate or posterior oropharyngeal erythema.  Eyes:     General: No scleral icterus.       Right eye: No discharge.        Left eye: No discharge.     Extraocular Movements: Extraocular movements intact.     Conjunctiva/sclera: Conjunctivae normal.     Pupils: Pupils are equal, round, and reactive to light.  Cardiovascular:     Rate and Rhythm: Normal rate and regular rhythm.  Pulmonary:     Effort: Pulmonary effort is normal. No respiratory distress.     Breath sounds: Normal breath sounds.  Abdominal:     General: Bowel sounds are normal.  Musculoskeletal:     Cervical back: No rigidity or tenderness.  Skin:    General: Skin is warm and dry.  Neurological:     Mental Status: She is alert and oriented to person, place, and time.  Psychiatric:        Mood and Affect: Mood normal.        Behavior: Behavior normal.      No results found for any visits on 12/21/21.    The 10-year ASCVD risk score (Arnett DK, et al., 2019) is: 4.3%  Assessment & Plan:   Problem List Items Addressed This Visit       Endocrine   Hypothyroidism   Relevant Orders   CBC   TSH     Nervous and Auditory   Dysfunction of right eustachian tube   Meniere's disease of right ear   Relevant Orders   Comprehensive metabolic panel   Urinalysis, Routine w reflex microscopic     Other   Elevated LDL cholesterol level   Relevant Orders   Comprehensive metabolic panel   Lipid panel   Vitamin D deficiency - Primary   Relevant Orders   VITAMIN D 25 Hydroxy (Vit-D Deficiency, Fractures)    Return in about 6 months (around 06/22/2022).  Continue current doses of atorvastatin, levothyroxine and high-dose weekly vitamin D.  We will make adjustments pending lab results.  Continue Dyazide per ENT Mnire's  Mliss Sax, MD

## 2021-12-31 DIAGNOSIS — Z23 Encounter for immunization: Secondary | ICD-10-CM | POA: Diagnosis not present

## 2022-01-08 ENCOUNTER — Other Ambulatory Visit: Payer: Self-pay | Admitting: Family Medicine

## 2022-01-08 DIAGNOSIS — E559 Vitamin D deficiency, unspecified: Secondary | ICD-10-CM

## 2022-01-26 IMAGING — DX DG LUMBAR SPINE 2-3V
2 series · 2 of 2 positions shown · non-contrast
Comparison: None.

CLINICAL DATA: Low back pain with right lower extremity
radiculopathy.

EXAM:
LUMBAR SPINE - 2-3 VIEW

[lumbar spine ap]
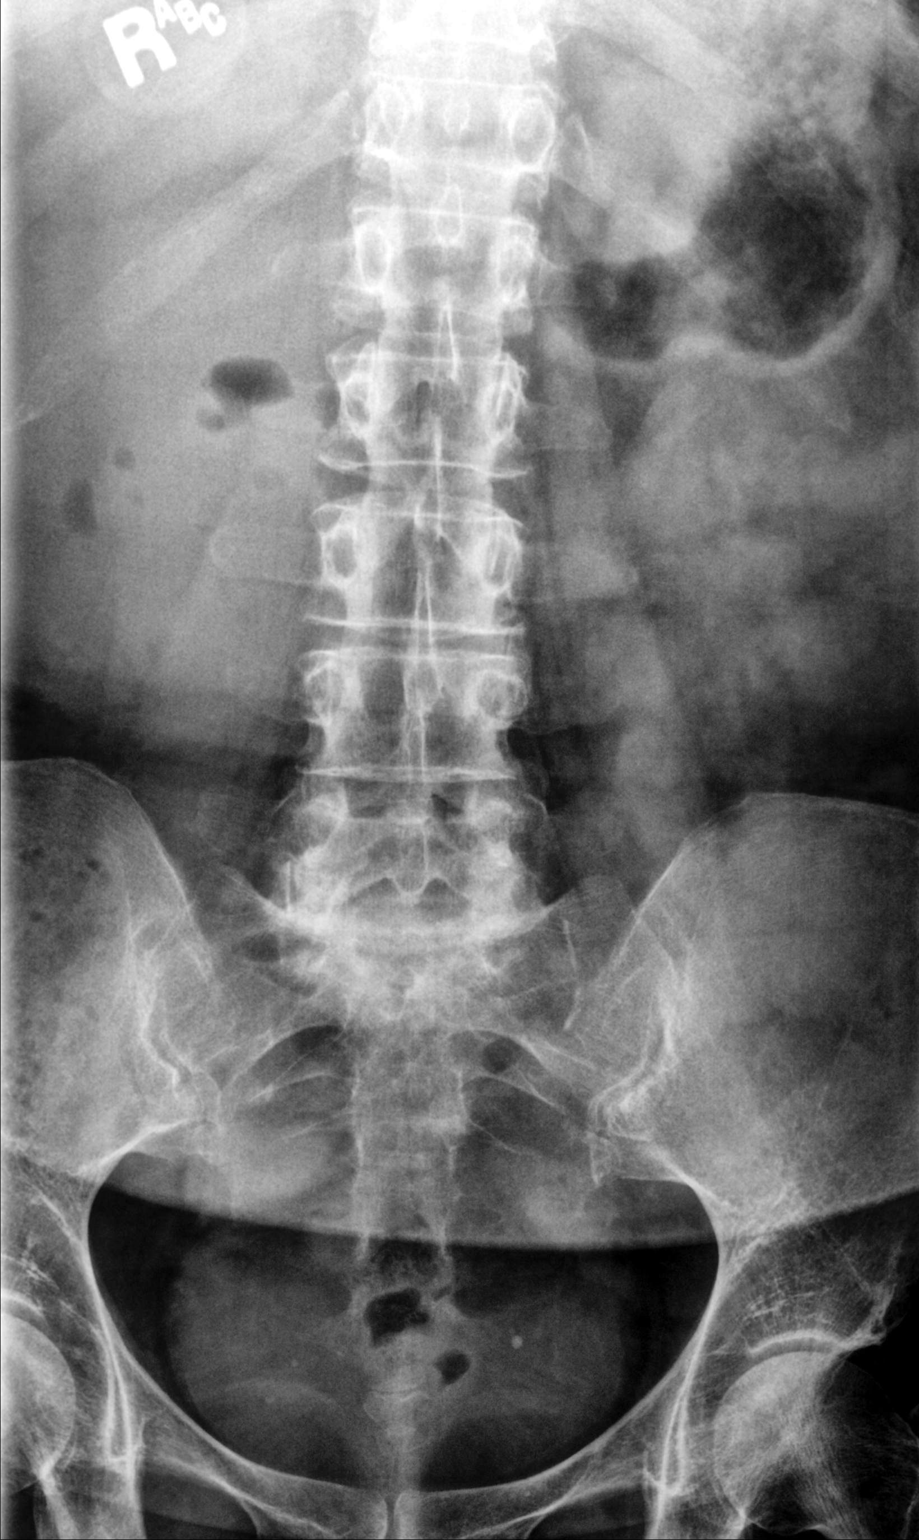

[lumbar spine lat]
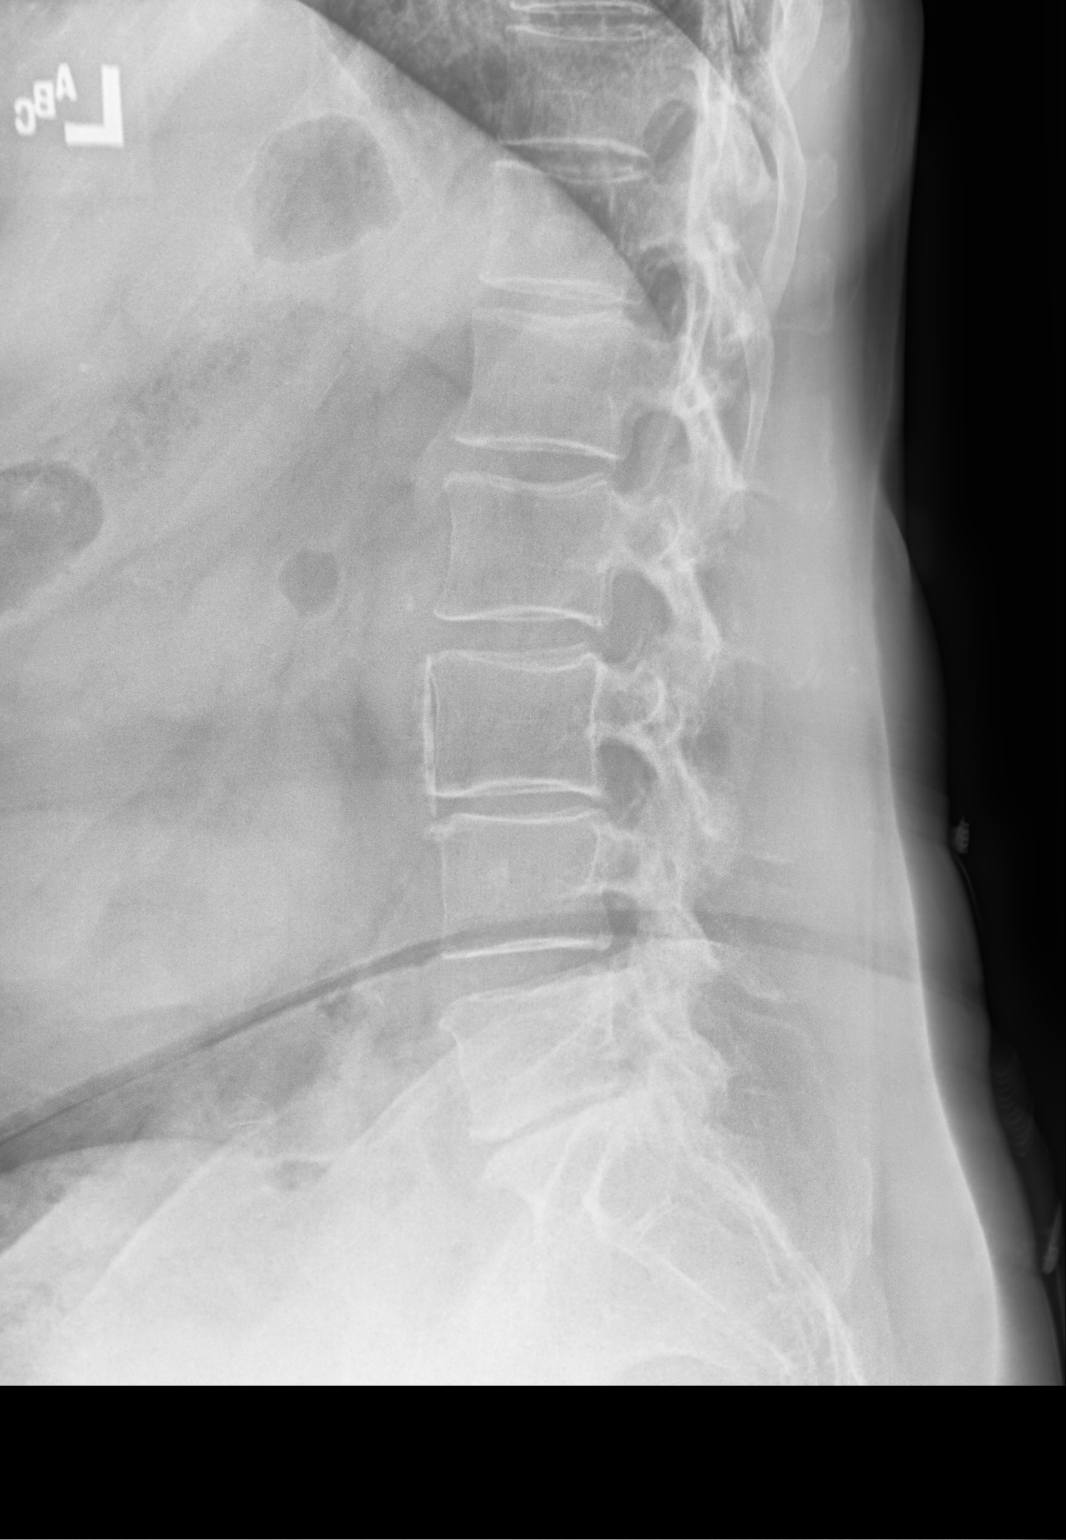

[2 of 2 positions shown; findings below may reference images not displayed]

FINDINGS: Vertebral body alignment and heights are normal. There is mild
spondylosis of the lumbar spine to include facet arthropathy over
the lower lumbar spine. Moderate disc space narrowing at the L5-S1
level and to lesser degree at the L3-4 level. No compression
fracture or spondylolisthesis. Mild degenerative change of the
visualized hips and sacroiliac joints.
IMPRESSION: Mild spondylosis of the lumbar spine with disc disease at the L3-4
and L5-S1 levels.

## 2022-01-27 ENCOUNTER — Other Ambulatory Visit: Payer: Self-pay | Admitting: Family Medicine

## 2022-01-27 DIAGNOSIS — E039 Hypothyroidism, unspecified: Secondary | ICD-10-CM

## 2022-02-14 IMAGING — CT CT ABD-PELV W/ CM
2 of 5 series · 15 of 46 positions shown, 17 images · IV contrast (omnipaque)
Comparison: 06/12/2019

CLINICAL DATA: Possible renal cystic change on recent MRI

EXAM:
CT ABDOMEN AND PELVIS WITH CONTRAST
TECHNIQUE: Multidetector CT imaging of the abdomen and pelvis was performed
using the standard protocol following bolus administration of
intravenous contrast.
CONTRAST:  100mL OMNIPAQUE IOHEXOL 300 MG/ML  SOLN

[Series 2: axial st · axial · 0.83mm/px · z∈[-482,-32]mm · 12 of 102 slices shown, 14 images]
[im 6/102  soft-tissue]
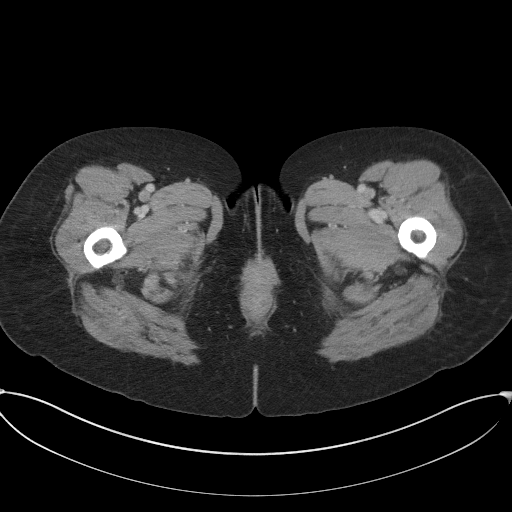
[im 6/102  bone]
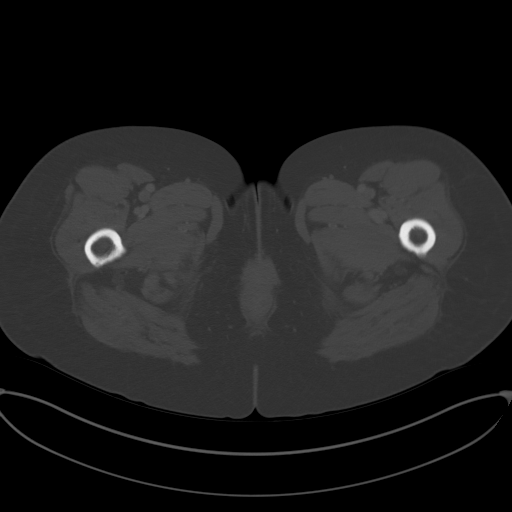
[im 18/102  soft-tissue]
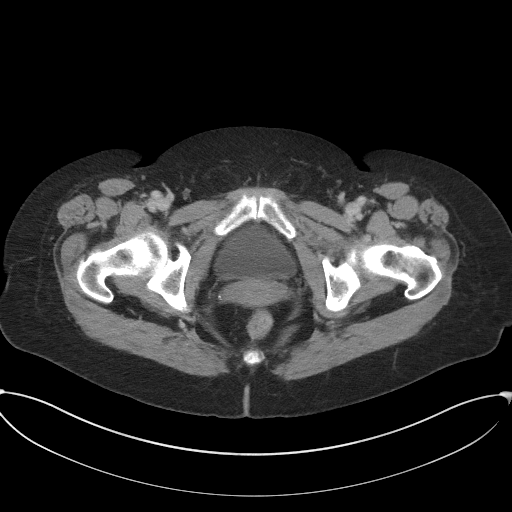
[im 24/102  soft-tissue]
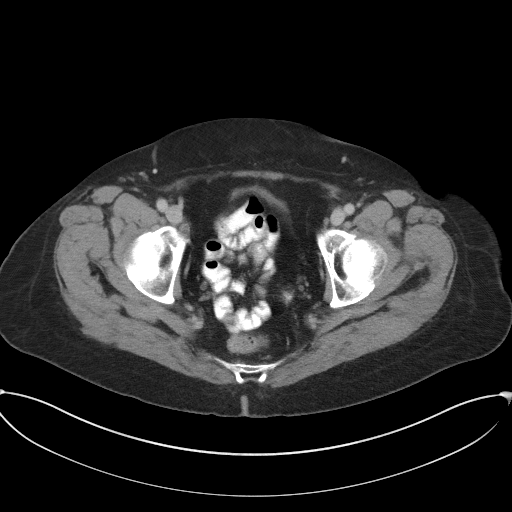
[im 30/102  soft-tissue]
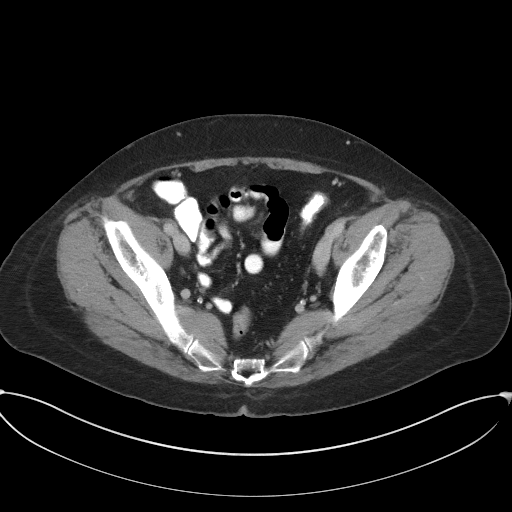
[im 42/102  soft-tissue]
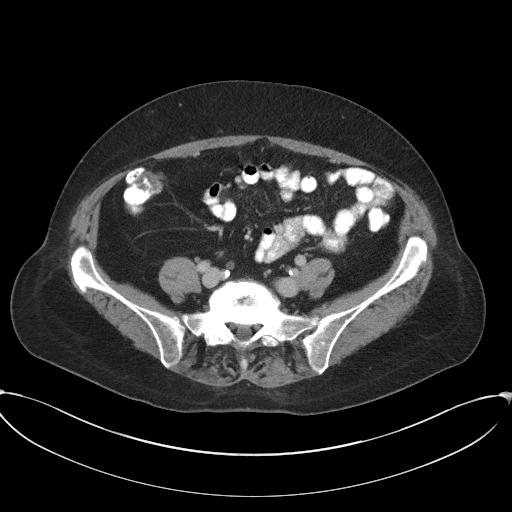
[im 48/102  soft-tissue]
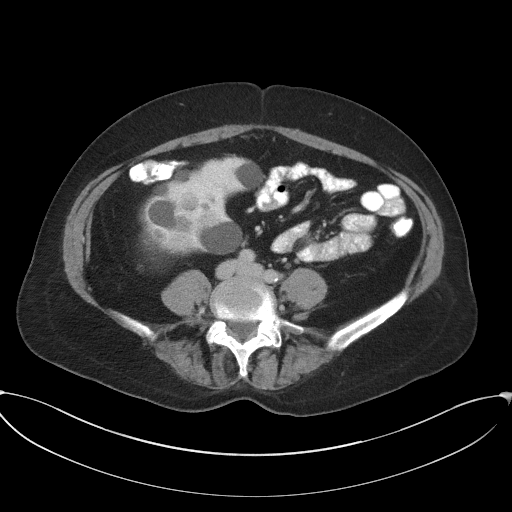
[im 54/102  soft-tissue]
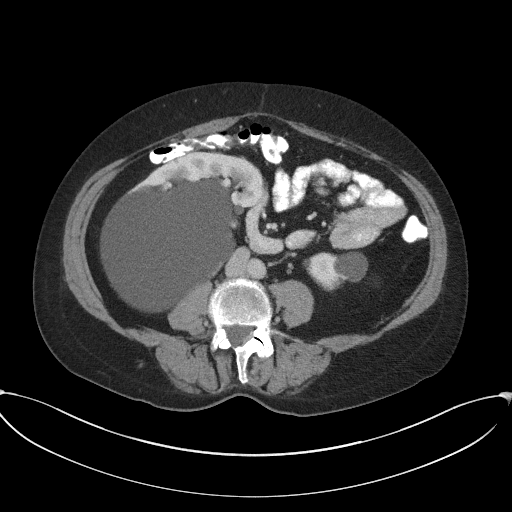
[im 66/102  soft-tissue]
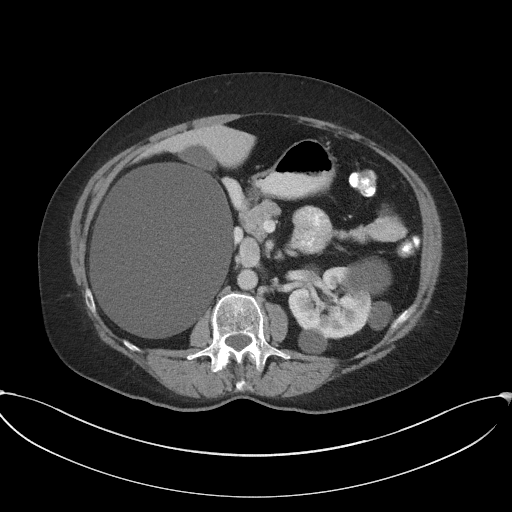
[im 72/102  soft-tissue]
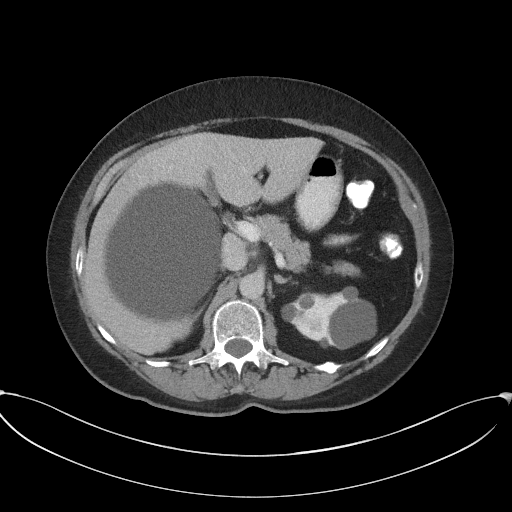
[im 72/102  bone]
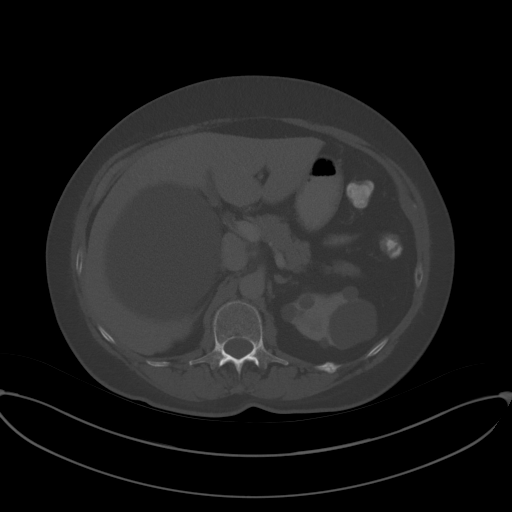
[im 78/102  soft-tissue]
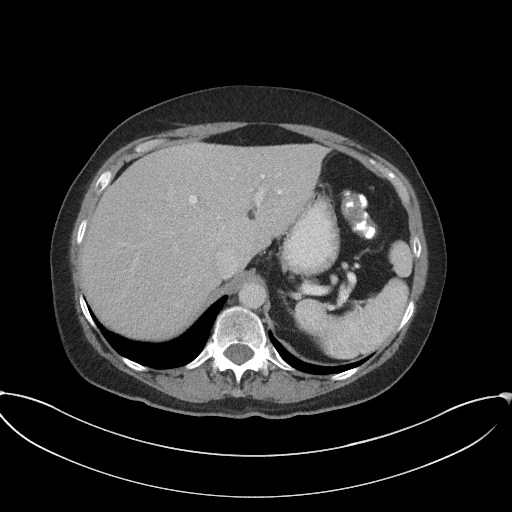
[im 90/102  soft-tissue]
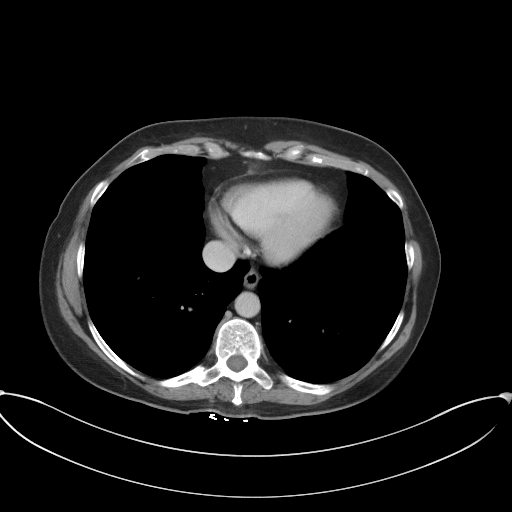
[im 96/102  soft-tissue]
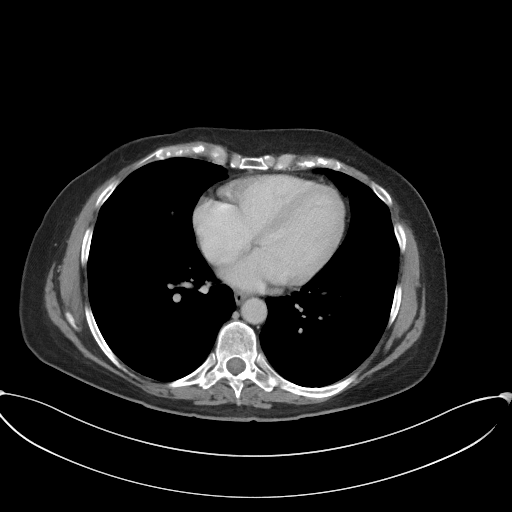

[Series 5: coronal st · coronal · 0.74mm/px · 3 of 95 slices shown]
[im 32/95  soft-tissue]
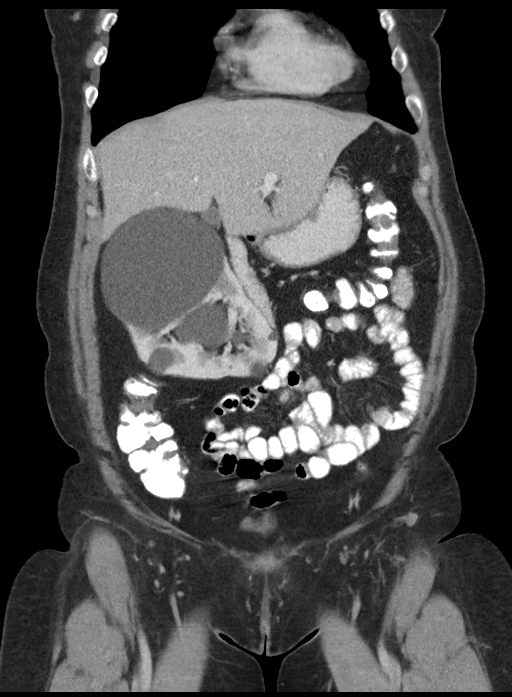
[im 42/95  soft-tissue]
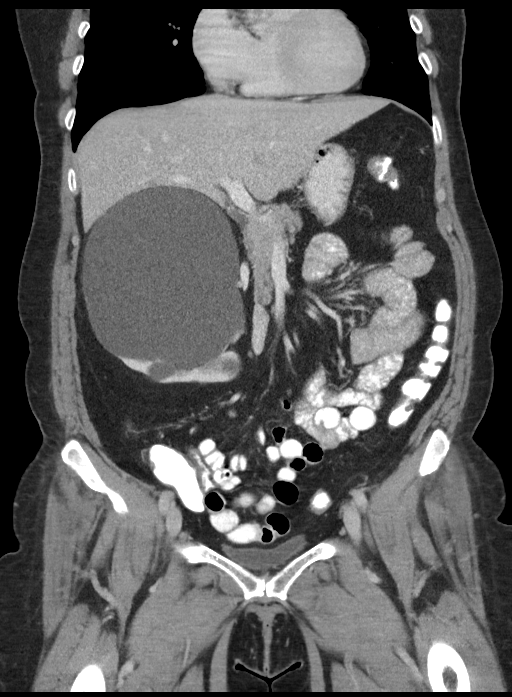
[im 53/95  soft-tissue]
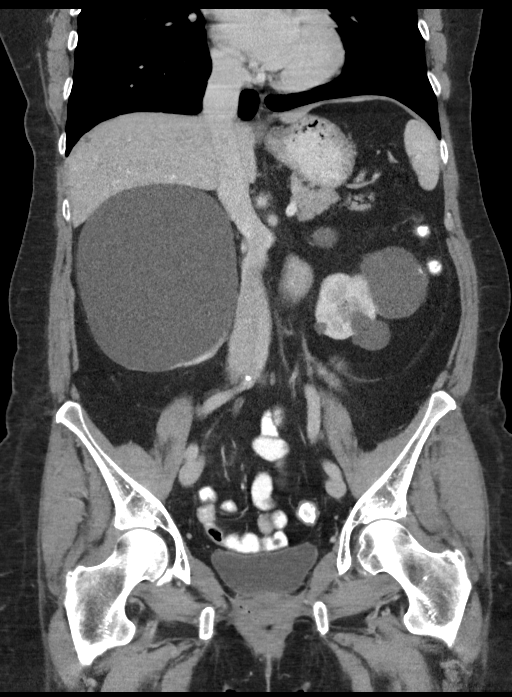

[15 of 46 positions shown; findings below may reference images not displayed]

FINDINGS: Lower chest: No acute abnormality.

Hepatobiliary: Mild fatty infiltration of the liver is noted. The
gallbladder is decompressed.

Pancreas: Unremarkable. No pancreatic ductal dilatation or
surrounding inflammatory changes.

Spleen: Normal in size without focal abnormality.

Adrenals/Urinary Tract: The adrenal glands are within normal limits.
Renal cystic changes are noted bilaterally. The largest of these
lies in posterior aspect of the right kidney measuring 15 cm in
greatest dimension. This corresponds with that seen on recent CT
examination. Largest cyst on the left measures 5.7 cm and
demonstrates some minimal calcification within the wall. The
remaining cystic lesions all appear simple in nature. No renal
calculi or obstructive changes are seen. No significant cyst
enhancement is noted on delayed images. Normal excretion of contrast
is seen. The bladder is decompressed.

Stomach/Bowel: The appendix is within normal limits. No obstructive
or inflammatory changes of the colon are seen. The stomach and small
bowel are unremarkable.

Vascular/Lymphatic: Aortic atherosclerosis. No enlarged abdominal or
pelvic lymph nodes.

Reproductive: Status post hysterectomy. No adnexal masses.

Other: No abdominal wall hernia or abnormality. No abdominopelvic
ascites.

Musculoskeletal: Mild degenerative changes of lumbar spine are seen.
IMPRESSION: Multiple bilateral renal cysts. The largest of these on the right
measures 15 cm. The largest of these on the left measures 5.7 cm.
They all appear simple in nature with the exception of the largest
cyst on the left which demonstrates some minimal calcification
within the wall. No significant enhancement is noted on delayed
images.

Fatty infiltration of the liver.

No other focal abnormality is noted.

## 2022-03-18 DIAGNOSIS — H43811 Vitreous degeneration, right eye: Secondary | ICD-10-CM | POA: Diagnosis not present

## 2022-04-01 ENCOUNTER — Encounter: Payer: Self-pay | Admitting: Family Medicine

## 2022-04-01 DIAGNOSIS — F4322 Adjustment disorder with anxiety: Secondary | ICD-10-CM

## 2022-04-05 ENCOUNTER — Other Ambulatory Visit: Payer: Self-pay | Admitting: Family Medicine

## 2022-04-05 DIAGNOSIS — E559 Vitamin D deficiency, unspecified: Secondary | ICD-10-CM

## 2022-04-10 ENCOUNTER — Ambulatory Visit (INDEPENDENT_AMBULATORY_CARE_PROVIDER_SITE_OTHER): Payer: Medicare Other | Admitting: Psychology

## 2022-04-10 DIAGNOSIS — F4322 Adjustment disorder with anxiety: Secondary | ICD-10-CM
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
## 2022-04-10 NOTE — Progress Notes (Unsigned)
? ? ? ? ? ? ? ? ? ? ? ? ? ? ?  Brianna Luna, LCMHC ?

## 2022-04-23 DIAGNOSIS — H2513 Age-related nuclear cataract, bilateral: Secondary | ICD-10-CM | POA: Diagnosis not present

## 2022-04-23 DIAGNOSIS — H43811 Vitreous degeneration, right eye: Secondary | ICD-10-CM | POA: Diagnosis not present

## 2022-04-24 ENCOUNTER — Ambulatory Visit (INDEPENDENT_AMBULATORY_CARE_PROVIDER_SITE_OTHER): Payer: Medicare Other | Admitting: Psychology

## 2022-04-24 DIAGNOSIS — F4322 Adjustment disorder with anxiety: Secondary | ICD-10-CM

## 2022-04-24 NOTE — Progress Notes (Signed)
[facility] Counselor/Therapist Progress Note  Patient ID: Brianna Luna, MRN: [id number],    Date: 04/24/2022  Time Spent: 1:30pm-2:35pm  Pt is seen for a virtual video visit via caregility.  Pt joins from her home, reporting privacy, and counselor from her home office.    Treatment Type: Individual Therapy  Reported Symptoms: no panic attacks, decreasing anxiety, continued tearfulness.  Mental Status Exam: Appearance:  Well Groomed     Behavior: Appropriate  Motor: Normal  Speech/Language:  Normal Rate  Affect: Appropriate  Mood: anxious and sad  Thought process: normal  Thought content:   WNL  Sensory/Perceptual disturbances:   WNL  Orientation: oriented to person, place, time/date, and situation  Attention: Good  Concentration: Good  Memory: WNL  Fund of knowledge:  Good  Insight:   Good  Judgment:  Good  Impulse Control: Good   Risk Assessment: Danger to Self:  No Self-injurious Behavior: No Danger to Others: No Duty to Warn:no Physical Aggression / Violence:No  Access to Firearms a concern: No  Gang Involvement:No   Subjective: counselor assessed pt current functioning per pt report.  Processed w/pt emotions and interactions w/ her daughter.  Explored w/pt coping skills utilizing w/ mindfulness and loving kindness meditation and continued use.  Discussed ways to set healthy boundaries for supporting daughter and being able to communicate effectively.  Pt affect wnl.  Pt reported that she has not had any further panic attacks.  Pt reports has been feeling weepy. Pt reports has been beneficial to use mindfulness and focus on what is her control to give sense of agency to self for coping. Pt shared resource of Loving Kindness and meta meditations book utilizing.  Pt reports that her daughter came over unannounced to pick some things up- informed she had been fired, that using their address as her permanent address and that using a parents shared credit care  for expenses.  Pt dicussed how feels that walking on egg shells w/ her daughter, not feeling authentic in interactions and some feeling used.  Pt asked daughter if willing to share address of where staying and daughter responded "she will think about".  Pt wanting to foster interactions w/ daughter, respect her privacy, support daughter and understand what daughter what's in relationship.    Interventions: Cognitive Behavioral Therapy, Mindfulness Meditation, Solution-Oriented/Positive Psychology, and supportive  Diagnosis:Adjustment disorder with anxious mood  Plan: Pt to f/u in a month for counseling to assist coping.  Pt to f/u w/ PCP as scheduled.  Individualized Treatment Plan Strengths: Pt enjoys Gets together w/ neighbors.  Cooks for whole neighborhood.  She has been Programmer, systems w/ cooking and baking.  Genealogy project w/ older daughter.    Supports: husband, friends/neighbors   Goal/Needs for Treatment:  In order of importance to patient 1) cope w/ anxiety and stress changes in relationship w/ daughter 2) maintain healthy boundaries in relationships 3) ---   Client Statement of Needs:  "learn how to love with healthy boundaries for self and to manage through anxiety that change is creating. "   Treatment Level:outpt counseling  Symptoms:panic attacks, ruminating/worry/ anxious on edge and sad/weepy.  Client Treatment Preferences:counseling at least [place] consumer's goal for treatment:  Counselor, Jan Fireman, [facility] will support the patient's ability to achieve the goals identified. Cognitive Behavioral Therapy, Assertive Communication/Conflict Resolution Training, Relaxation Training, ACT, Humanistic and other evidenced-based practices will be used to promote progress towards healthy functioning.   Healthcare consumer will: Actively participate in therapy, working towards  healthy functioning.    *Justification for Continuation/Discontinuation of Goal: R=Revised,  O=Ongoing, A=Achieved, D=Discontinued  Goal 1) Utilizing self care, mindfulness, relaxation strategies to manage through stress and reduce anxiety and sadness related to current relationship changes.   Baseline date 04/24/22: Progress towards goal 0; How Often - Daily Target Date Goal Was reviewed Status Code Progress towards goal/Likert rating  04/25/23                Goal 2) identify and establish healthy boundaries in relationships and utilize effective communication to express boundaries and resolve conflicts.  Baseline date 04/24/22: Progress towards goal 0; How Often - Daily Target Date Goal Was reviewed Status Code Progress towards goal  04/25/23                  This plan has been reviewed and created by the following participants:  This plan will be reviewed at least every 12 months. Date Behavioral Health Clinician Date Guardian/Patient   04/24/22  [facility] 04/24/22 Verbal Consent Provided                    Goal for pt is to learn how to love with healthy boundaries for self and to manage through anxiety that change is creating.   Jan Fireman, [facility]

## 2022-04-24 NOTE — Progress Notes (Signed)
? ? ? ? ? ? ? ? ? ? ? ? ? ? ?  Corrina Steffensen, LCMHC ?

## 2022-06-28 ENCOUNTER — Other Ambulatory Visit: Payer: Self-pay | Admitting: Family Medicine

## 2022-06-28 DIAGNOSIS — B002 Herpesviral gingivostomatitis and pharyngotonsillitis: Secondary | ICD-10-CM

## 2022-07-03 ENCOUNTER — Encounter: Payer: Self-pay | Admitting: Family Medicine

## 2022-07-04 ENCOUNTER — Encounter: Payer: Self-pay | Admitting: Family Medicine

## 2022-07-04 ENCOUNTER — Ambulatory Visit (INDEPENDENT_AMBULATORY_CARE_PROVIDER_SITE_OTHER): Payer: Medicare Other | Admitting: Family Medicine

## 2022-07-04 VITALS — BP 126/76 | HR 63 | Temp 98.3°F | Ht 64.0 in | Wt 172.6 lb

## 2022-07-04 DIAGNOSIS — E559 Vitamin D deficiency, unspecified: Secondary | ICD-10-CM

## 2022-07-04 DIAGNOSIS — E7800 Pure hypercholesterolemia, unspecified: Secondary | ICD-10-CM

## 2022-07-04 DIAGNOSIS — L309 Dermatitis, unspecified: Secondary | ICD-10-CM | POA: Diagnosis not present

## 2022-07-04 DIAGNOSIS — E039 Hypothyroidism, unspecified: Secondary | ICD-10-CM

## 2022-07-04 LAB — URINALYSIS, ROUTINE W REFLEX MICROSCOPIC
Bilirubin Urine: NEGATIVE
Hgb urine dipstick: NEGATIVE
Ketones, ur: NEGATIVE
Leukocytes,Ua: NEGATIVE
Nitrite: NEGATIVE
RBC / HPF: NONE SEEN (ref 0–?)
Specific Gravity, Urine: <=1.005 — AB (ref 1.000–1.030)
Total Protein, Urine: NEGATIVE
Urine Glucose: NEGATIVE
Urobilinogen, UA: 0.2 (ref 0.0–1.0)
pH: 6.5 (ref 5.0–8.0)

## 2022-07-04 LAB — COMPREHENSIVE METABOLIC PANEL
ALT: 18 U/L (ref 0–35)
AST: 19 U/L (ref 0–37)
Albumin: 4.6 g/dL (ref 3.5–5.2)
Alkaline Phosphatase: 93 U/L (ref 39–117)
BUN: 11 mg/dL (ref 6–23)
CO2: 26 mEq/L (ref 19–32)
Calcium: 9.6 mg/dL (ref 8.4–10.5)
Chloride: 100 mEq/L (ref 96–112)
Creatinine, Ser: 0.82 mg/dL (ref 0.40–1.20)
GFR: 73.98 mL/min (ref >60.00)
Glucose, Bld: 94 mg/dL (ref 70–99)
Potassium: 4.1 mEq/L (ref 3.5–5.1)
Sodium: 136 mEq/L (ref 135–145)
Total Bilirubin: 0.9 mg/dL (ref 0.2–1.2)
Total Protein: 6.8 g/dL (ref 6.0–8.3)

## 2022-07-04 LAB — VITAMIN D 25 HYDROXY (VIT D DEFICIENCY, FRACTURES): VITD: 22.11 ng/mL — ABNORMAL LOW (ref 30.00–100.00)

## 2022-07-04 LAB — CBC
HCT: 41.9 % (ref 36.0–46.0)
Hemoglobin: 14.5 g/dL (ref 12.0–15.0)
MCHC: 34.6 g/dL (ref 30.0–36.0)
MCV: 90.7 fl (ref 78.0–100.0)
Platelets: 222 10*3/uL (ref 150.0–400.0)
RBC: 4.62 Mil/uL (ref 3.87–5.11)
RDW: 13.2 % (ref 11.5–15.5)
WBC: 5.5 10*3/uL (ref 4.0–10.5)

## 2022-07-04 LAB — LIPID PANEL
Cholesterol: 184 mg/dL (ref 0–200)
HDL: 66.1 mg/dL (ref >39.00)
LDL Cholesterol: 95 mg/dL (ref 0–99)
NonHDL: 117.73
Total CHOL/HDL Ratio: 3
Triglycerides: 112 mg/dL (ref 0.0–149.0)
VLDL: 22.4 mg/dL (ref 0.0–40.0)

## 2022-07-04 LAB — TSH: TSH: 0.52 u[IU]/mL (ref 0.35–5.50)

## 2022-07-04 MED ORDER — TRIAMCINOLONE ACETONIDE 0.1 % EX CREA: TOPICAL_CREAM | Freq: Two times a day (BID) | CUTANEOUS | 0 refills | Status: DC

## 2022-07-04 MED ORDER — CEPHALEXIN 500 MG PO CAPS
500.0000 mg | ORAL_CAPSULE | Freq: Three times a day (TID) | ORAL | 0 refills | Status: AC
Start: 2022-07-04 — End: 2022-07-14

## 2022-07-04 NOTE — Progress Notes (Addendum)
Established Patient Office Visit   Subjective:  Patient ID: Brianna Luna, female    DOB: [birth year]  Age: 67 y.o. MRN: [id number]  Chief Complaint  Patient presents with   Hemorrhoids    Hemorrhoids very itchy burning becoming worse.     HPI Encounter Diagnoses  Name Primary?   Perianal dermatitis Yes   Vitamin D deficiency    Acquired hypothyroidism    Elevated LDL cholesterol level    Longstanding history of intermittent rash in the perianal area.  It burns and is pruritic at times.  History of perirectal hemorrhoids.  She denies constipation or prolonged toilet bowl sitting.  She has tried changing toilet tissues using cotton only underwear.  Continues atorvastatin 20 and levothyroxine 75 mcg cholesterol and hypothyroidism.   Review of Systems  Constitutional: Negative.   HENT: Negative.    Eyes:  Negative for blurred vision, discharge and redness.  Respiratory: Negative.    Cardiovascular: Negative.   Gastrointestinal:  Negative for abdominal pain.  Genitourinary: Negative.   Musculoskeletal: Negative.  Negative for myalgias.  Skin:  Positive for itching and rash.  Neurological:  Negative for tingling, loss of consciousness and weakness.  Endo/Heme/Allergies:  Negative for polydipsia.  Thanks.   Current Outpatient Medications:    atorvastatin (LIPITOR) 20 MG tablet, TAKE ONE TABLET BY MOUTH DAILY, Disp: 90 tablet, Rfl: 3   cephALEXin (KEFLEX) 500 MG capsule, Take 1 capsule (500 mg total) by mouth 3 (three) times daily for 10 days., Disp: 30 capsule, Rfl: 0   Cholecalciferol (VITAMIN D3) 125 MCG (5000 UT) CAPS, Take 1 capsule (5,000 Units total) by mouth daily., Disp: 90 capsule, Rfl: 2   clobetasol cream (TEMOVATE) 0.05 %, Apply 1 application topically as needed., Disp: , Rfl:    levothyroxine (SYNTHROID) 75 MCG tablet, TAKE 1 TABLET BY MOUTH EVERY MORNING 1 HOUR BEFORE EATING, Disp: 90 tablet, Rfl: 3   triamterene-hydrochlorothiazide (DYAZIDE) 37.5-25 MG capsule,  Take 1 capsule by mouth every morning., Disp: , Rfl:    valACYclovir (VALTREX) 1000 MG tablet, TAKE 1/2 TABLET BY MOUTH TWICE A DAY FOR 3 DAYS, Disp: 12 tablet, Rfl: 1   ketoconazole 2%-triamcinolone 0.1% 1:2 cream mixture, Apply topically 2 (two) times daily., Disp: 45 g, Rfl: 0   Objective:     BP 126/76 (BP Location: Right Arm, Patient Position: Sitting, Cuff Size: Normal)   Pulse 63   Temp 98.3 F (36.8 C) (Temporal)   Ht 5\' 4"  (1.626 m)   Wt 172 lb 9.6 oz (78.3 kg)   SpO2 97%   BMI 29.63 kg/m    Physical Exam Constitutional:      General: She is not in acute distress.    Appearance: Normal appearance. She is not ill-appearing, toxic-appearing or diaphoretic.  HENT:     Head: Normocephalic and atraumatic.     Right Ear: External ear normal.     Left Ear: External ear normal.  Eyes:     General: No scleral icterus.       Right eye: No discharge.        Left eye: No discharge.     Extraocular Movements: Extraocular movements intact.     Conjunctiva/sclera: Conjunctivae normal.  Pulmonary:     Effort: Pulmonary effort is normal. No respiratory distress.  Genitourinary:   Skin:    General: Skin is warm and dry.  Neurological:     Mental Status: She is alert and oriented to person, place, and time.  Psychiatric:  Mood and Affect: Mood normal.        Behavior: Behavior normal.      Results for orders placed or performed in visit on 07/04/22  VITAMIN D 25 Hydroxy (Vit-D Deficiency, Fractures)  Result Value Ref Range   VITD 22.11 (L) 30.00 - 100.00 ng/mL  CBC  Result Value Ref Range   WBC 5.5 4.0 - 10.5 K/uL   RBC 4.62 3.87 - 5.11 Mil/uL   Platelets 222.0 150.0 - 400.0 K/uL   Hemoglobin 14.5 12.0 - 15.0 g/dL   HCT 16.1 09.6 - 04.5 %   MCV 90.7 78.0 - 100.0 fl   MCHC 34.6 30.0 - 36.0 g/dL   RDW 40.9 81.1 - 91.4 %  Comprehensive metabolic panel  Result Value Ref Range   Sodium 136 135 - 145 mEq/L   Potassium 4.1 3.5 - 5.1 mEq/L   Chloride 100 96 - 112  mEq/L   CO2 26 19 - 32 mEq/L   Glucose, Bld 94 70 - 99 mg/dL   BUN 11 6 - 23 mg/dL   Creatinine, Ser 7.82 0.40 - 1.20 mg/dL   Total Bilirubin 0.9 0.2 - 1.2 mg/dL   Alkaline Phosphatase 93 39 - 117 U/L   AST 19 0 - 37 U/L   ALT 18 0 - 35 U/L   Total Protein 6.8 6.0 - 8.3 g/dL   Albumin 4.6 3.5 - 5.2 g/dL   GFR 95.62 >13.08 mL/min   Calcium 9.6 8.4 - 10.5 mg/dL  Lipid panel  Result Value Ref Range   Cholesterol 184 0 - 200 mg/dL   Triglycerides 657.8 0.0 - 149.0 mg/dL   HDL 46.96 >29.52 mg/dL   VLDL 84.1 0.0 - 32.4 mg/dL   LDL Cholesterol 95 0 - 99 mg/dL   Total CHOL/HDL Ratio 3    NonHDL 117.73   Urinalysis, Routine w reflex microscopic  Result Value Ref Range   Color, Urine YELLOW Yellow;Lt. Yellow;Straw;Dark Yellow;Amber;Green;Red;Brown   APPearance CLEAR Clear;Turbid;Slightly Cloudy;Cloudy   Specific Gravity, Urine <=1.005 (A) 1.000 - 1.030   pH 6.5 5.0 - 8.0   Total Protein, Urine NEGATIVE Negative   Urine Glucose NEGATIVE Negative   Ketones, ur NEGATIVE Negative   Bilirubin Urine NEGATIVE Negative   Hgb urine dipstick NEGATIVE Negative   Urobilinogen, UA 0.2 0.0 - 1.0   Leukocytes,Ua NEGATIVE Negative   Nitrite NEGATIVE Negative   WBC, UA 0-2/hpf 0-2/hpf   RBC / HPF none seen 0-2/hpf   Squamous Epithelial / HPF Rare(0-4/hpf) Rare(0-4/hpf)  TSH  Result Value Ref Range   TSH 0.52 0.35 - 5.50 uIU/mL      The 10-year ASCVD risk score (Arnett DK, et al., 2019) is: 6.1%    Assessment & Plan:   Perianal dermatitis -     Cephalexin; Take 1 capsule (500 mg total) by mouth 3 (three) times daily for 10 days.  Dispense: 30 capsule; Refill: 0  Vitamin D deficiency -     VITAMIN D 25 Hydroxy (Vit-D Deficiency, Fractures) -     Vitamin D3; Take 1 capsule (5,000 Units total) by mouth daily.  Dispense: 90 capsule; Refill: 2  Acquired hypothyroidism -     CBC -     TSH  Elevated LDL cholesterol level -     Comprehensive metabolic panel -     Lipid panel -      Urinalysis, Routine w reflex microscopic    Return Return in 2 to 3 weeks for follow-up and physical..    Chrissie Noa  Krystal Clark, MD

## 2022-07-05 ENCOUNTER — Telehealth: Payer: Self-pay | Admitting: Family Medicine

## 2022-07-05 DIAGNOSIS — L309 Dermatitis, unspecified: Secondary | ICD-10-CM

## 2022-07-05 MED ORDER — VITAMIN D3 125 MCG (5000 UT) PO CAPS: 5000.0000 [IU] | ORAL_CAPSULE | Freq: Every day | ORAL | 2 refills | Status: DC

## 2022-07-05 MED ORDER — TRIAMCINOLONE ACETONIDE 0.1 % EX CREA: TOPICAL_CREAM | Freq: Two times a day (BID) | CUTANEOUS | 0 refills | Status: DC

## 2022-07-05 NOTE — Telephone Encounter (Signed)
Prescription sent to requested pharmacy, called patient to inform. No answer [id number]

## 2022-07-05 NOTE — Addendum Note (Signed)
Addended by: Andrez Grime on: 07/05/2022 12:28 PM   Modules accepted: Orders

## 2022-07-05 NOTE — Telephone Encounter (Signed)
Pt has called to request that her ketoconazole 2%-triamcinolone 0.1% 1:2 cream mixture  can not be filled at her [facility] because they are not a compound pharmacy. She has found one that is. It is [facility] t #[phone] Fax # [phone]

## 2022-07-09 ENCOUNTER — Telehealth: Payer: Self-pay | Admitting: Family Medicine

## 2022-07-09 NOTE — Telephone Encounter (Signed)
Called patient to schedule Medicare Annual Wellness Visit (AWV). Left message for patient to call back and schedule Medicare Annual Wellness Visit (AWV).  Last date of AWV: 07/18/21  Please schedule an appointment at any time with [facility].  If any questions, please contact me at [phone].  Thank you ,  Eliabeth Shoff [facility] AWV direct phone # [phone] 

## 2022-07-10 ENCOUNTER — Telehealth: Payer: Self-pay | Admitting: Family Medicine

## 2022-07-10 NOTE — Telephone Encounter (Signed)
Called patient to schedule Medicare Annual Wellness Visit (AWV). Left message for patient to call back and schedule Medicare Annual Wellness Visit (AWV).  Last date of AWV: 07/18/21  Please schedule an appointment at any time with [facility].  If any questions, please contact me at [phone].  Thank you ,  Rudell Cobb AWV direct phone # [phone]  Patient returned my call and left message  Returned patients call

## 2022-07-25 ENCOUNTER — Encounter: Payer: Self-pay | Admitting: Family Medicine

## 2022-07-25 ENCOUNTER — Ambulatory Visit (INDEPENDENT_AMBULATORY_CARE_PROVIDER_SITE_OTHER): Payer: Medicare Other | Admitting: Family Medicine

## 2022-07-25 VITALS — BP 132/70 | HR 77 | Temp 97.3°F | Ht 64.0 in | Wt 171.6 lb

## 2022-07-25 DIAGNOSIS — Z0001 Encounter for general adult medical examination with abnormal findings: Secondary | ICD-10-CM

## 2022-07-25 DIAGNOSIS — Z6379 Other stressful life events affecting family and household: Secondary | ICD-10-CM | POA: Diagnosis not present

## 2022-07-25 DIAGNOSIS — L309 Dermatitis, unspecified: Secondary | ICD-10-CM | POA: Diagnosis not present

## 2022-07-25 DIAGNOSIS — Z0000 Encounter for general adult medical examination without abnormal findings: Secondary | ICD-10-CM

## 2022-07-25 MED ORDER — TRIAMCINOLONE ACETONIDE 0.1 % EX CREA: TOPICAL_CREAM | Freq: Two times a day (BID) | CUTANEOUS | 0 refills | Status: AC

## 2022-07-25 MED ORDER — FLUCONAZOLE 150 MG PO TABS: 150.0000 mg | ORAL_TABLET | Freq: Once | ORAL | 0 refills | Status: DC

## 2022-07-25 NOTE — Progress Notes (Signed)
Established Patient Office Visit   Subjective:  Patient ID: Brianna Luna, female    DOB: Jul 06, 1955  Age: 67 y.o. MRN: [id number]  Chief Complaint  Patient presents with   Annual Exam    CPE, check itchy spot on right hand, itchy rash have come back at rectum. Patient fasting.     HPI Encounter Diagnoses  Name Primary?   Healthcare maintenance Yes   Perianal dermatitis    Stressful life event affecting family    Here for physical.  Continues to live at home with her husband.  Relationship is good.  On the way up to [place] to see to their like college.  There is stress in the family.  Daughter who was married a few years ago has left her husband secondary to abuse.  She is in contact with patient but will let her know her whereabouts.  Medications seem to help the PERI anal dermatitis.  But seems to return to after finishing.  She is active by walking for at least 30 minutes daily.  Has access to regular dental care.   Review of Systems  Constitutional: Negative.   HENT: Negative.    Eyes:  Negative for blurred vision, discharge and redness.  Respiratory: Negative.    Cardiovascular: Negative.   Gastrointestinal:  Negative for abdominal pain.  Genitourinary: Negative.   Musculoskeletal: Negative.  Negative for myalgias.  Skin:  Negative for rash.  Neurological:  Negative for tingling, loss of consciousness and weakness.  Endo/Heme/Allergies:  Negative for polydipsia.  Psychiatric/Behavioral:  The patient is nervous/anxious.       07/25/2022    9:07 AM 07/25/2022    8:28 AM 07/04/2022    1:11 PM  Depression screen PHQ 2/9  Decreased Interest 0 0 0  Down, Depressed, Hopeless 1 1 1   PHQ - 2 Score 1 1 1   Altered sleeping 1    Tired, decreased energy 2    Change in appetite 0    Feeling bad or failure about yourself  0    Trouble concentrating 0    Moving slowly or fidgety/restless 0    Suicidal thoughts 0    PHQ-9 Score 4    Difficult doing work/chores Somewhat  difficult         Current Outpatient Medications:    atorvastatin (LIPITOR) 20 MG tablet, TAKE ONE TABLET BY MOUTH DAILY, Disp: 90 tablet, Rfl: 3   Cholecalciferol (VITAMIN D3) 125 MCG (5000 UT) CAPS, Take 1 capsule (5,000 Units total) by mouth daily., Disp: 90 capsule, Rfl: 2   clobetasol cream (TEMOVATE) 0.05 %, Apply 1 application topically as needed., Disp: , Rfl:    fluconazole (DIFLUCAN) 150 MG tablet, Take 1 tablet (150 mg total) by mouth once for 1 dose. Repeat again in one week., Disp: 2 tablet, Rfl: 0   levothyroxine (SYNTHROID) 75 MCG tablet, TAKE 1 TABLET BY MOUTH EVERY MORNING 1 HOUR BEFORE EATING, Disp: 90 tablet, Rfl: 3   triamterene-hydrochlorothiazide (DYAZIDE) 37.5-25 MG capsule, Take 1 capsule by mouth every morning., Disp: , Rfl:    valACYclovir (VALTREX) 1000 MG tablet, TAKE 1/2 TABLET BY MOUTH TWICE A DAY FOR 3 DAYS, Disp: 12 tablet, Rfl: 1   ketoconazole 2%-triamcinolone 0.1% 1:2 cream mixture, Apply topically 2 (two) times daily., Disp: 45 g, Rfl: 0   Objective:     BP 132/70 (BP Location: Right Arm, Patient Position: Sitting, Cuff Size: Large)   Pulse 77   Temp (!) 97.3 F (36.3 C) (Temporal)   Ht  5\' 4"  (1.626 m)   Wt 171 lb 9.6 oz (77.8 kg)   SpO2 95%   BMI 29.46 kg/m    Physical Exam Constitutional:      General: She is not in acute distress.    Appearance: Normal appearance. She is not ill-appearing, toxic-appearing or diaphoretic.  HENT:     Head: Normocephalic and atraumatic.     Right Ear: External ear normal.     Left Ear: External ear normal.     Mouth/Throat:     Mouth: Mucous membranes are moist.     Pharynx: Oropharynx is clear. No oropharyngeal exudate or posterior oropharyngeal erythema.  Eyes:     General: No scleral icterus.       Right eye: No discharge.        Left eye: No discharge.     Extraocular Movements: Extraocular movements intact.     Conjunctiva/sclera: Conjunctivae normal.     Pupils: Pupils are equal, round, and  reactive to light.  Cardiovascular:     Rate and Rhythm: Normal rate and regular rhythm.  Pulmonary:     Effort: Pulmonary effort is normal. No respiratory distress.     Breath sounds: Normal breath sounds.  Abdominal:     General: Bowel sounds are normal.  Musculoskeletal:     Cervical back: No rigidity or tenderness.  Skin:    General: Skin is warm and dry.  Neurological:     Mental Status: She is alert and oriented to person, place, and time.  Psychiatric:        Mood and Affect: Mood normal.        Behavior: Behavior normal.      No results found for any visits on 07/25/22.    The 10-year ASCVD risk score (Arnett DK, et al., 2019) is: 6.6%    Assessment & Plan:   Healthcare maintenance  Perianal dermatitis -     ketoconazole 2%-triamcinolone 0.1% 1:2 cream mixture; Apply topically 2 (two) times daily.  Dispense: 45 g; Refill: 0 -     Fluconazole; Take 1 tablet (150 mg total) by mouth once for 1 dose. Repeat again in one week.  Dispense: 2 tablet; Refill: 0  Stressful life event affecting family    Return in about 6 months (around 01/25/2023), or if symptoms worsen or fail to improve.  Continue counseling for family stress.  Could consider medications.  Continue ketoconazole/triamcinolone cream.  Have added Diflucan weekly x 2.  Information was given on health maintenance and disease prevention.  Continue exercising and regular dental care.  Mliss Sax, MD

## 2022-09-26 ENCOUNTER — Other Ambulatory Visit: Payer: Self-pay | Admitting: Family Medicine

## 2022-09-26 DIAGNOSIS — L309 Dermatitis, unspecified: Secondary | ICD-10-CM

## 2022-12-03 ENCOUNTER — Other Ambulatory Visit: Payer: Self-pay | Admitting: Family Medicine

## 2022-12-03 DIAGNOSIS — E7800 Pure hypercholesterolemia, unspecified: Secondary | ICD-10-CM

## 2022-12-09 DIAGNOSIS — Z23 Encounter for immunization: Secondary | ICD-10-CM | POA: Diagnosis not present

## 2023-01-20 ENCOUNTER — Other Ambulatory Visit: Payer: Self-pay | Admitting: Family Medicine

## 2023-01-20 ENCOUNTER — Other Ambulatory Visit: Payer: Self-pay

## 2023-01-20 DIAGNOSIS — E039 Hypothyroidism, unspecified: Secondary | ICD-10-CM

## 2023-01-20 MED ORDER — LEVOTHYROXINE SODIUM 75 MCG PO TABS: ORAL_TABLET | ORAL | 0 refills | Status: DC

## 2023-01-20 NOTE — Telephone Encounter (Signed)
Tried to send to pharmacy and was unable to get it to go through electronically and it printed twice. Called [facility] and gave a verbal okay for refill for the Levothyroxine 75 mg.  Spoke to [employer], Teacher, early years/pre.  Dm/cma

## 2023-01-20 NOTE — Addendum Note (Signed)
Addended by: Mickle Plumb L on: 01/20/2023 11:08 AM   Modules accepted: Orders

## 2023-02-12 ENCOUNTER — Other Ambulatory Visit: Payer: Self-pay | Admitting: Family Medicine

## 2023-02-18 ENCOUNTER — Encounter: Payer: Self-pay | Admitting: Family Medicine

## 2023-02-18 DIAGNOSIS — L409 Psoriasis, unspecified: Secondary | ICD-10-CM

## 2023-02-18 DIAGNOSIS — L309 Dermatitis, unspecified: Secondary | ICD-10-CM

## 2023-02-20 MED ORDER — CEPHALEXIN 500 MG PO CAPS: 500.0000 mg | ORAL_CAPSULE | Freq: Three times a day (TID) | ORAL | 0 refills | Status: AC

## 2023-02-20 MED ORDER — CLOBETASOL PROPIONATE 0.05 % EX CREA: 1.0000 | TOPICAL_CREAM | CUTANEOUS | 0 refills | Status: DC | PRN

## 2023-02-25 MED ORDER — CLOBETASOL PROPIONATE 0.05 % EX SOLN: 1.0000 | Freq: Two times a day (BID) | CUTANEOUS | 0 refills | Status: AC

## 2023-02-25 NOTE — Addendum Note (Signed)
Addended by: Andrez Grime on: 02/25/2023 08:45 AM   Modules accepted: Orders

## 2023-04-09 DIAGNOSIS — K080 Exfoliation of teeth due to systemic causes: Secondary | ICD-10-CM | POA: Diagnosis not present

## 2023-04-10 ENCOUNTER — Other Ambulatory Visit: Payer: Self-pay | Admitting: Family Medicine

## 2023-04-10 DIAGNOSIS — E039 Hypothyroidism, unspecified: Secondary | ICD-10-CM

## 2023-04-11 ENCOUNTER — Encounter: Payer: Self-pay | Admitting: Family Medicine

## 2023-04-15 ENCOUNTER — Ambulatory Visit (INDEPENDENT_AMBULATORY_CARE_PROVIDER_SITE_OTHER): Payer: Medicare Other | Admitting: Family Medicine

## 2023-04-15 ENCOUNTER — Encounter: Payer: Self-pay | Admitting: Family Medicine

## 2023-04-15 VITALS — BP 122/82 | HR 67 | Temp 98.4°F | Ht 64.0 in | Wt 171.8 lb

## 2023-04-15 DIAGNOSIS — E039 Hypothyroidism, unspecified: Secondary | ICD-10-CM | POA: Diagnosis not present

## 2023-04-15 DIAGNOSIS — E7800 Pure hypercholesterolemia, unspecified: Secondary | ICD-10-CM | POA: Diagnosis not present

## 2023-04-15 DIAGNOSIS — Z1231 Encounter for screening mammogram for malignant neoplasm of breast: Secondary | ICD-10-CM

## 2023-04-15 DIAGNOSIS — E559 Vitamin D deficiency, unspecified: Secondary | ICD-10-CM | POA: Diagnosis not present

## 2023-04-15 DIAGNOSIS — L309 Dermatitis, unspecified: Secondary | ICD-10-CM | POA: Diagnosis not present

## 2023-04-15 DIAGNOSIS — Z131 Encounter for screening for diabetes mellitus: Secondary | ICD-10-CM | POA: Diagnosis not present

## 2023-04-15 DIAGNOSIS — L409 Psoriasis, unspecified: Secondary | ICD-10-CM

## 2023-04-15 LAB — TSH: TSH: 0.29 u[IU]/mL — ABNORMAL LOW (ref 0.35–5.50)

## 2023-04-15 LAB — BASIC METABOLIC PANEL
BUN: 19 mg/dL (ref 6–23)
CO2: 30 meq/L (ref 19–32)
Calcium: 9.6 mg/dL (ref 8.4–10.5)
Chloride: 101 meq/L (ref 96–112)
Creatinine, Ser: 0.78 mg/dL (ref 0.40–1.20)
GFR: 78.13 mL/min (ref >60.00)
Glucose, Bld: 76 mg/dL (ref 70–99)
Potassium: 3.9 meq/L (ref 3.5–5.1)
Sodium: 138 meq/L (ref 135–145)

## 2023-04-15 LAB — HEMOGLOBIN A1C: Hgb A1c MFr Bld: 5.5 % (ref 4.6–6.5)

## 2023-04-15 LAB — VITAMIN D 25 HYDROXY (VIT D DEFICIENCY, FRACTURES): VITD: 22.19 ng/mL — ABNORMAL LOW (ref 30.00–100.00)

## 2023-04-15 LAB — LDL CHOLESTEROL, DIRECT: Direct LDL: 107 mg/dL

## 2023-04-15 MED ORDER — VITAMIN D3 125 MCG (5000 UT) PO CAPS
5000.0000 [IU] | ORAL_CAPSULE | Freq: Every day | ORAL | 2 refills | Status: DC
Start: 2023-04-15 — End: 2023-07-15

## 2023-04-15 MED ORDER — LEVOTHYROXINE SODIUM 125 MCG PO TABS: 62.5000 ug | ORAL_TABLET | Freq: Every day | ORAL | 3 refills | Status: DC

## 2023-04-15 MED ORDER — LEVOTHYROXINE SODIUM 75 MCG PO TABS: ORAL_TABLET | ORAL | 0 refills | Status: DC

## 2023-04-15 NOTE — Addendum Note (Signed)
Addended by: Andrez Grime on: 04/15/2023 05:05 PM   Modules accepted: Orders

## 2023-04-15 NOTE — Progress Notes (Addendum)
Established Patient Office Visit   Subjective:  Patient ID: Brianna Luna, female    DOB: 27-Aug-1955  Age: 68 y.o. MRN: [id number]  Chief Complaint  Patient presents with   Medical Management of Chronic Issues    Follow up and med refill    HPI Encounter Diagnoses  Name Primary?   Elevated LDL cholesterol level Yes   Acquired hypothyroidism    Vitamin D deficiency    Perianal dermatitis    Psoriasis of scalp    Breast cancer screening by mammogram    Screening for diabetes mellitus    Follow-up of above.  Continues atorvastatin for prevention of cardiovascular disease with a strong family history of CAD.  Continues levothyroxine.  Continues 5000 units vitamin D daily.  History of psoriasis of scalp.  Perianal irritation persists.  She is semiretired and works as needed.  She is exercising by walking for about an hour daily.  Has regular dental care.   Review of Systems  Constitutional: Negative.   HENT: Negative.    Eyes:  Negative for blurred vision, discharge and redness.  Respiratory: Negative.    Cardiovascular: Negative.   Gastrointestinal:  Negative for abdominal pain.  Genitourinary: Negative.   Musculoskeletal: Negative.  Negative for myalgias.  Skin:  Negative for rash.  Neurological:  Negative for tingling, loss of consciousness and weakness.  Endo/Heme/Allergies:  Negative for polydipsia.     Current Outpatient Medications:    atorvastatin (LIPITOR) 20 MG tablet, TAKE 1 TABLET BY MOUTH DAILY, Disp: 90 tablet, Rfl: 3   clobetasol (TEMOVATE) 0.05 % external solution, Apply 1 Application topically 2 (two) times daily., Disp: 50 mL, Rfl: 0   ketoconazole 2%-triamcinolone 0.1% 1:2 cream mixture, Apply topically 2 (two) times daily., Disp: 45 g, Rfl: 0   levothyroxine (SYNTHROID) 125 MCG tablet, Take 0.5 tablets (62.5 mcg total) by mouth daily. Each morning at least 30 minutes before eating., Disp: 45 tablet, Rfl: 3   valACYclovir (VALTREX) 1000 MG tablet, TAKE  1/2 TABLET BY MOUTH TWICE A DAY FOR 3 DAYS, Disp: 12 tablet, Rfl: 1   Cholecalciferol (VITAMIN D3) 125 MCG (5000 UT) CAPS, Take 1 capsule (5,000 Units total) by mouth daily., Disp: 90 capsule, Rfl: 2   triamterene-hydrochlorothiazide (DYAZIDE) 37.5-25 MG capsule, Take 1 capsule by mouth every morning., Disp: , Rfl:    Objective:     BP 122/82   Pulse 67   Temp 98.4 F (36.9 C)   Ht 5\' 4"  (1.626 m)   Wt 171 lb 12.8 oz (77.9 kg)   SpO2 97%   BMI 29.49 kg/m    Physical Exam Constitutional:      General: She is not in acute distress.    Appearance: Normal appearance. She is not ill-appearing, toxic-appearing or diaphoretic.  HENT:     Head: Normocephalic and atraumatic.     Right Ear: Tympanic membrane, ear canal and external ear normal.     Left Ear: Tympanic membrane, ear canal and external ear normal.     Mouth/Throat:     Mouth: Mucous membranes are moist.     Pharynx: Oropharynx is clear. No oropharyngeal exudate or posterior oropharyngeal erythema.  Eyes:     General: No scleral icterus.       Right eye: No discharge.        Left eye: No discharge.     Extraocular Movements: Extraocular movements intact.     Conjunctiva/sclera: Conjunctivae normal.     Pupils: Pupils are equal, round, and reactive  to light.  Neck:     Thyroid: No thyromegaly or thyroid tenderness.  Cardiovascular:     Rate and Rhythm: Normal rate and regular rhythm.  Pulmonary:     Effort: Pulmonary effort is normal. No respiratory distress.     Breath sounds: Normal breath sounds.  Abdominal:     General: Bowel sounds are normal.     Tenderness: There is no abdominal tenderness. There is no guarding.  Musculoskeletal:     Cervical back: No rigidity or tenderness.  Lymphadenopathy:     Cervical: No cervical adenopathy.  Skin:    General: Skin is warm and dry.  Neurological:     Mental Status: She is alert and oriented to person, place, and time.  Psychiatric:        Mood and Affect: Mood  normal.        Behavior: Behavior normal.      Results for orders placed or performed in visit on 04/15/23  Basic metabolic panel  Result Value Ref Range   Sodium 138 135 - 145 mEq/L   Potassium 3.9 3.5 - 5.1 mEq/L   Chloride 101 96 - 112 mEq/L   CO2 30 19 - 32 mEq/L   Glucose, Bld 76 70 - 99 mg/dL   BUN 19 6 - 23 mg/dL   Creatinine, Ser 9.60 0.40 - 1.20 mg/dL   GFR 45.40 >98.11 mL/min   Calcium 9.6 8.4 - 10.5 mg/dL  LDL cholesterol, direct  Result Value Ref Range   Direct LDL 107.0 mg/dL  TSH  Result Value Ref Range   TSH 0.29 (L) 0.35 - 5.50 uIU/mL  VITAMIN D 25 Hydroxy (Vit-D Deficiency, Fractures)  Result Value Ref Range   VITD 22.19 (L) 30.00 - 100.00 ng/mL      The 10-year ASCVD risk score (Arnett DK, et al., 2019) is: 6.4%    Assessment & Plan:   Elevated LDL cholesterol level -     LDL cholesterol, direct  Acquired hypothyroidism -     TSH -     Levothyroxine Sodium; Take 0.5 tablets (62.5 mcg total) by mouth daily. Each morning at least 30 minutes before eating.  Dispense: 45 tablet; Refill: 3  Vitamin D deficiency -     Vitamin D3; Take 1 capsule (5,000 Units total) by mouth daily.  Dispense: 90 capsule; Refill: 2 -     VITAMIN D 25 Hydroxy (Vit-D Deficiency, Fractures)  Perianal dermatitis -     Ambulatory referral to Dermatology  Psoriasis of scalp -     Ambulatory referral to Dermatology  Breast cancer screening by mammogram -     3D Screening Mammogram, Left and Right; Future  Screening for diabetes mellitus -     Basic metabolic panel -     Hemoglobin A1c    Return in about 3 months (around 07/13/2023), or if symptoms worsen or fail to improve.    Mliss Sax, MD

## 2023-04-21 ENCOUNTER — Ambulatory Visit
Admission: RE | Admit: 2023-04-21 | Discharge: 2023-04-21 | Disposition: A | Discharge status: Home / Self Care | Payer: Medicare Other | Source: Ambulatory Visit | Attending: Family Medicine | Admitting: Family Medicine

## 2023-04-21 DIAGNOSIS — Z1231 Encounter for screening mammogram for malignant neoplasm of breast: Secondary | ICD-10-CM

## 2023-04-24 DIAGNOSIS — L089 Local infection of the skin and subcutaneous tissue, unspecified: Secondary | ICD-10-CM | POA: Diagnosis not present

## 2023-05-06 DIAGNOSIS — K080 Exfoliation of teeth due to systemic causes: Secondary | ICD-10-CM | POA: Diagnosis not present

## 2023-06-04 DIAGNOSIS — L400 Psoriasis vulgaris: Secondary | ICD-10-CM | POA: Diagnosis not present

## 2023-06-04 DIAGNOSIS — D229 Melanocytic nevi, unspecified: Secondary | ICD-10-CM | POA: Diagnosis not present

## 2023-06-04 DIAGNOSIS — L603 Nail dystrophy: Secondary | ICD-10-CM | POA: Diagnosis not present

## 2023-06-04 DIAGNOSIS — B009 Herpesviral infection, unspecified: Secondary | ICD-10-CM | POA: Diagnosis not present

## 2023-06-04 DIAGNOSIS — D2272 Melanocytic nevi of left lower limb, including hip: Secondary | ICD-10-CM | POA: Diagnosis not present

## 2023-07-10 DIAGNOSIS — K080 Exfoliation of teeth due to systemic causes: Secondary | ICD-10-CM | POA: Diagnosis not present

## 2023-07-15 ENCOUNTER — Ambulatory Visit: Payer: Self-pay | Admitting: Family Medicine

## 2023-07-15 ENCOUNTER — Ambulatory Visit (INDEPENDENT_AMBULATORY_CARE_PROVIDER_SITE_OTHER): Payer: Medicare Other | Admitting: Family Medicine

## 2023-07-15 VITALS — BP 118/80 | HR 66 | Temp 97.2°F | Ht 64.0 in | Wt 174.0 lb

## 2023-07-15 DIAGNOSIS — E039 Hypothyroidism, unspecified: Secondary | ICD-10-CM

## 2023-07-15 DIAGNOSIS — E559 Vitamin D deficiency, unspecified: Secondary | ICD-10-CM

## 2023-07-15 DIAGNOSIS — E7800 Pure hypercholesterolemia, unspecified: Secondary | ICD-10-CM | POA: Diagnosis not present

## 2023-07-15 LAB — TSH: TSH: 1.26 u[IU]/mL (ref 0.35–5.50)

## 2023-07-15 LAB — LDL CHOLESTEROL, DIRECT: Direct LDL: 109 mg/dL

## 2023-07-15 MED ORDER — VITAMIN D (ERGOCALCIFEROL) 1.25 MG (50000 UNIT) PO CAPS: 50000.0000 [IU] | ORAL_CAPSULE | ORAL | 1 refills | Status: DC

## 2023-07-15 NOTE — Progress Notes (Signed)
 Established Patient Office Visit   Subjective:  Patient ID: Brianna Luna, female    DOB: August 20, 1955  Age: 68 y.o. MRN: [id number]  Chief Complaint  Patient presents with   Medical Management of Chronic Issues    Follow up. Pt is fasting.     HPI Encounter Diagnoses  Name Primary?   Acquired hypothyroidism Yes   Vitamin D  deficiency    Elevated LDL cholesterol level    Follow-up of above.  Was never able to fill vitamin D  prescription given.  Has been having some trouble cutting the 125 mcg dose of levothyroxine  by half.  Has been taking her atorvastatin  20 mg daily.  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   Review of Systems  Constitutional: Negative.   HENT: Negative.    Eyes:  Negative for blurred vision, discharge and redness.  Respiratory: Negative.    Cardiovascular: Negative.   Gastrointestinal:  Negative for abdominal pain.  Genitourinary: Negative.   Musculoskeletal: Negative.  Negative for myalgias.  Skin:  Negative for rash.  Neurological:  Negative for tingling, loss of consciousness and weakness.  Endo/Heme/Allergies:  Negative for polydipsia.     Current Outpatient Medications:    atorvastatin  (LIPITOR) 20 MG tablet, TAKE 1 TABLET BY MOUTH DAILY, Disp: 90 tablet, Rfl: 3   levothyroxine  (SYNTHROID ) 125 MCG tablet, Take 0.5 tablets (62.5 mcg total) by mouth daily. Each morning at least 30 minutes before eating., Disp: 45 tablet, Rfl: 3   valACYclovir  (VALTREX ) 1000 MG tablet, TAKE 1/2 TABLET BY MOUTH TWICE A DAY FOR 3 DAYS, Disp: 12 tablet, Rfl: 1   Vitamin D , Ergocalciferol , (DRISDOL ) 1.25 MG (50000 UNIT) CAPS capsule, Take 1 capsule (50,000 Units total) by mouth every 7 (seven) days., Disp: 12 capsule, Rfl: 1   clobetasol  (TEMOVATE ) 0.05 % external solution, Apply 1 Application topically 2 (two) times daily. (Patient not taking: Reported on 07/15/2023), Disp: 50 mL, Rfl: 0   ketoconazole  2%-triamcinolone  0.1% 1:2 cream mixture, Apply topically 2 (two) times daily.  (Patient not taking: Reported on 07/15/2023), Disp: 45 g, Rfl: 0   triamterene-hydrochlorothiazide (DYAZIDE) 37.5-25 MG capsule, Take 1 capsule by mouth every morning., Disp: , Rfl:    Objective:     BP 118/80 (Cuff Size: Normal)   Pulse 66   Temp (!) 97.2 F (36.2 C) (Temporal)   Ht 5\' 4"  (1.626 m)   Wt 174 lb (78.9 kg)   SpO2 97%   BMI 29.87 kg/m    Physical Exam Constitutional:      General: She is not in acute distress.    Appearance: Normal appearance. She is not ill-appearing, toxic-appearing or diaphoretic.  HENT:     Head: Normocephalic and atraumatic.     Right Ear: External ear normal.     Left Ear: External ear normal.  Eyes:     General: No scleral icterus.       Right eye: No discharge.        Left eye: No discharge.     Extraocular Movements: Extraocular movements intact.     Conjunctiva/sclera: Conjunctivae normal.  Pulmonary:     Effort: Pulmonary effort is normal. No respiratory distress.  Skin:    General: Skin is warm and dry.  Neurological:     Mental Status: She is alert and oriented to person, place, and time.  Psychiatric:        Mood and Affect: Mood normal.        Behavior: Behavior normal.      No results  found for any visits on 07/15/23.    The 10-year ASCVD risk score (Arnett DK, et al., 2019) is: 6%    Assessment & Plan:   Acquired hypothyroidism -     TSH  Vitamin D  deficiency -     Vitamin D  (Ergocalciferol ); Take 1 capsule (50,000 Units total) by mouth every 7 (seven) days.  Dispense: 12 capsule; Refill: 1  Elevated LDL cholesterol level -     LDL cholesterol, direct    Return in about 6 months (around 01/15/2024), or if symptoms worsen or fail to improve.  Start weekly high-dose vitamin D  tablet.  Adjustments to atorvastatin  and levothyroxine  may pending results of today's labs.  She will make an appointment with GI for follow-up of hemorrhoids and perianal inflammation.  Tonna Frederic, MD

## 2023-08-08 ENCOUNTER — Ambulatory Visit

## 2023-08-22 ENCOUNTER — Ambulatory Visit

## 2023-09-19 ENCOUNTER — Encounter: Payer: Self-pay | Admitting: Family Medicine

## 2023-09-19 DIAGNOSIS — H8101 Meniere's disease, right ear: Secondary | ICD-10-CM

## 2023-09-19 DIAGNOSIS — H903 Sensorineural hearing loss, bilateral: Secondary | ICD-10-CM

## 2023-09-19 MED ORDER — TRIAMTERENE-HCTZ 37.5-25 MG PO CAPS: 1.0000 | ORAL_CAPSULE | Freq: Every morning | ORAL | 1 refills | Status: DC

## 2023-10-14 ENCOUNTER — Ambulatory Visit: Payer: Medicare Other | Admitting: Family Medicine

## 2023-11-21 ENCOUNTER — Ambulatory Visit

## 2023-11-21 DIAGNOSIS — Z0000 Encounter for general adult medical examination without abnormal findings: Secondary | ICD-10-CM | POA: Diagnosis not present

## 2023-11-21 NOTE — Progress Notes (Signed)
 Subjective:   Brianna Luna is a 68 y.o. who presents for a Medicare Wellness preventive visit.  As a reminder, Annual Wellness Visits don't include a physical exam, and some assessments may be limited, especially if this visit is performed virtually. We may recommend an in-person follow-up visit with your provider if needed.  Visit Complete: Virtual I connected with  Brianna Luna on 11/21/23 by a video and audio enabled telemedicine application and verified that I am speaking with the correct person using two identifiers.  Patient Location: Home  Provider Location: Office/Clinic  I discussed the limitations of evaluation and management by telemedicine. The patient expressed understanding and agreed to proceed.  Vital Signs: Because this visit was a virtual/telehealth visit, some criteria may be missing or patient reported. Any vitals not documented were not able to be obtained and vitals that have been documented are patient reported.    Persons Participating in Visit: Patient.  AWV Questionnaire: No: Patient Medicare AWV questionnaire was not completed prior to this visit.  Cardiac Risk Factors include: advanced age (>80men, >37 women)     Objective:    Today's Vitals   There is no height or weight on file to calculate BMI.     11/21/2023    8:54 AM 07/18/2021    2:31 PM 06/08/2019   12:06 PM  Advanced Directives  Does Patient Have a Medical Advance Directive? Yes Yes Yes  Type of Estate agent of [place];Living will Healthcare Power of [place];Living will Healthcare Power of [place];Living will  Copy of Healthcare Power of Attorney in Chart? No - copy requested No - copy requested No - copy requested    Current Medications (verified) Outpatient Encounter Medications as of 11/21/2023  Medication Sig   atorvastatin  (LIPITOR) 20 MG tablet TAKE 1 TABLET BY MOUTH DAILY   clobetasol  (TEMOVATE ) 0.05 % external solution Apply 1 Application  topically 2 (two) times daily.   levothyroxine  (SYNTHROID ) 125 MCG tablet Take 0.5 tablets (62.5 mcg total) by mouth daily. Each morning at least 30 minutes before eating.   triamterene -hydrochlorothiazide (DYAZIDE) 37.5-25 MG capsule Take 1 each (1 capsule total) by mouth every morning.   valACYclovir  (VALTREX ) 1000 MG tablet TAKE 1/2 TABLET BY MOUTH TWICE A DAY FOR 3 DAYS   Vitamin D , Ergocalciferol , (DRISDOL ) 1.25 MG (50000 UNIT) CAPS capsule Take 1 capsule (50,000 Units total) by mouth every 7 (seven) days.   ketoconazole  2%-triamcinolone  0.1% 1:2 cream mixture Apply topically 2 (two) times daily. (Patient not taking: Reported on 11/21/2023)   No facility-administered encounter medications on file as of 11/21/2023.    Allergies (verified) Levaquin [levofloxacin]   History: Past Medical History:  Diagnosis Date   Hyperlipidemia    Hypothyroid    Vitamin D  deficiency    Past Surgical History:  Procedure Laterality Date   ABDOMINAL HYSTERECTOMY     CESAREAN SECTION     Family History  Problem Relation Age of Onset   COPD Mother    Heart disease Mother    Breast cancer Neg Hx    Social History   Socioeconomic History   Marital status: Married    Spouse name: Not on file   Number of children: 2   Years of education: Not on file   Highest education level: Bachelor's degree (e.g., BA, AB, BS)  Occupational History   Not on file  Tobacco Use   Smoking status: Never   Smokeless tobacco: Never  Vaping Use   Vaping status: Never Used  Substance  and Sexual Activity   Alcohol use: Yes    Comment: occ   Drug use: No   Sexual activity: Yes  Other Topics Concern   Not on file  Social History Narrative   Not on file   Social Drivers of Health   Financial Resource Strain: Low Risk  (11/21/2023)   Overall Financial Resource Strain (CARDIA)    Difficulty of Paying Living Expenses: Not hard at all  Food Insecurity: No Food Insecurity (11/21/2023)   Hunger Vital Sign     Worried About Running Out of Food in the Last Year: Never true    Ran Out of Food in the Last Year: Never true  Transportation Needs: No Transportation Needs (11/21/2023)   PRAPARE - Administrator, Civil Service (Medical): No    Lack of Transportation (Non-Medical): No  Physical Activity: Sufficiently Active (11/21/2023)   Exercise Vital Sign    Days of Exercise per Week: 7 days    Minutes of Exercise per Session: 40 min  Stress: No Stress Concern Present (11/21/2023)   [employer] of Occupational Health - Occupational Stress Questionnaire    Feeling of Stress: Only a little  Social Connections: Moderately Isolated (11/21/2023)   Social Connection and Isolation Panel    Frequency of Communication with Friends and Family: More than three times a week    Frequency of Social Gatherings with Friends and Family: Three times a week    Attends Religious Services: Never    Active Member of Clubs or Organizations: No    Attends Banker Meetings: Never    Marital Status: Married    Tobacco Counseling Counseling given: Not Answered    Clinical Intake:  Pre-visit preparation completed: Yes  Pain : No/denies pain     Nutritional Risks: None Diabetes: No  Lab Results  Component Value Date   HGBA1C 5.5 04/15/2023     How often do you need to have someone help you when you read instructions, pamphlets, or other written materials from your doctor or pharmacy?: 1 - Never  Interpreter Needed?: No  Information entered by :: NAllen LPN   Activities of Daily Living     11/21/2023    8:47 AM  In your present state of health, do you have any difficulty performing the following activities:  Hearing? 0  Vision? 0  Difficulty concentrating or making decisions? 0  Walking or climbing stairs? 0  Dressing or bathing? 0  Doing errands, shopping? 0  Preparing Food and eating ? N  Using the Toilet? N  In the past six months, have you accidently leaked urine? Y   Comment once  Do you have problems with loss of bowel control? N  Managing your Medications? N  Managing your Finances? N  Housekeeping or managing your Housekeeping? N    Patient Care Team: Berneta Elsie Sayre, MD as PCP - General (Family Medicine)  I have updated your Care Teams any recent Medical Services you may have received from other providers in the past year.     Assessment:   This is a routine wellness examination for Brianna Luna.  Hearing/Vision screen Hearing Screening - Comments:: Denies hearing issues Vision Screening - Comments:: Regular eye exams, [facility]   Goals Addressed             This Visit's Progress    Patient Stated       11/21/2023, wants to incorporate yoga and more stretching into routine  Depression Screen     11/21/2023    8:55 AM 07/25/2022    9:07 AM 07/25/2022    8:28 AM 07/04/2022    1:11 PM 12/21/2021   11:10 AM 07/18/2021    2:32 PM 07/18/2021    2:30 PM  PHQ 2/9 Scores  PHQ - 2 Score 1 1 1 1  0 0 0  PHQ- 9 Score 3 4         Fall Risk     11/21/2023    8:54 AM 07/25/2022    8:28 AM 07/04/2022    1:11 PM 12/21/2021   11:10 AM 07/18/2021    2:31 PM  Fall Risk   Falls in the past year? 0 0 0 0 0  Number falls in past yr: 0 0 0 0 0  Injury with Fall? 0 0 0  0  Risk for fall due to : Medication side effect No Fall Risks No Fall Risks    Follow up Falls evaluation completed;Falls prevention discussed Falls evaluation completed Falls evaluation completed  Falls evaluation completed      Data saved with a previous flowsheet row definition    MEDICARE RISK AT HOME:  Medicare Risk at Home Any stairs in or around the home?: Yes If so, are there any without handrails?: No Home free of loose throw rugs in walkways, pet beds, electrical cords, etc?: Yes Adequate lighting in your home to reduce risk of falls?: Yes Life alert?: No Use of a cane, walker or w/c?: No Grab bars in the bathroom?: No Shower chair or bench in shower?:  No Elevated toilet seat or a handicapped toilet?: No  TIMED UP AND GO:  Was the test performed?  No  Cognitive Function: 6CIT completed        11/21/2023    8:58 AM  6CIT Screen  What Year? 0 points  What month? 0 points  What time? 0 points  Count back from 20 0 points  Months in reverse 0 points  Repeat phrase 0 points  Total Score 0 points    Immunizations Immunization History  Administered Date(s) Administered   Fluad Quad(high Dose 65+) 12/21/2020, 11/20/2023   Influenza Whole 12/13/2021   Influenza,inj,Quad PF,6+ Mos 12/20/2019   Influenza-Unspecified 01/22/2016, 01/21/2018   PFIZER(Purple Top)SARS-COV-2 Vaccination 05/14/2019, 06/07/2019, 01/12/2020, 11/16/2020   PNEUMOCOCCAL CONJUGATE-20 12/21/2020   Pfizer(Comirnaty)Fall Seasonal Vaccine 12 years and older 11/20/2023   Tdap 03/29/2021   Zoster, Live 01/22/2016    Screening Tests Health Maintenance  Topic Date Due   Zoster Vaccines- Shingrix (1 of 2) 01/05/2005   Mammogram  04/20/2024   Medicare Annual Wellness (AWV)  11/20/2024   Colonoscopy  05/28/2026   DTaP/Tdap/Td (2 - Td or Tdap) 03/30/2031   Pneumococcal Vaccine: 50+ Years  Completed   Influenza Vaccine  Completed   DEXA SCAN  Completed   Hepatitis C Screening  Completed   HPV VACCINES  Aged Out   Meningococcal B Vaccine  Aged Out   COVID-19 Vaccine  Discontinued    Health Maintenance Items Addressed: Due for shingles vaccine.  Additional Screening:  Vision Screening: Recommended annual ophthalmology exams for early detection of glaucoma and other disorders of the eye. Is the patient up to date with their annual eye exam?  Yes  Who is the provider or what is the name of the office in which the patient attends annual eye exams? [facility]  Dental Screening: Recommended annual dental exams for proper oral hygiene  Community Resource Referral / Chronic  Care Management: CRR required this visit?  No   CCM required this visit?   No   Plan:    I have personally reviewed and noted the following in the patient's chart:   Medical and social history Use of alcohol, tobacco or illicit drugs  Current medications and supplements including opioid prescriptions. Patient is not currently taking opioid prescriptions. Functional ability and status Nutritional status Physical activity Advanced directives List of other physicians Hospitalizations, surgeries, and ER visits in previous 12 months Vitals Screenings to include cognitive, depression, and falls Referrals and appointments  In addition, I have reviewed and discussed with patient certain preventive protocols, quality metrics, and best practice recommendations. A written personalized care plan for preventive services as well as general preventive health recommendations were provided to patient.   Ardella FORBES Dawn, LPN   [id number]   After Visit Summary: (MyChart) Due to this being a telephonic visit, the after visit summary with patients personalized plan was offered to patient via MyChart   Notes: Nothing significant to report at this time.

## 2023-11-21 NOTE — Patient Instructions (Addendum)
 Brianna Luna,  Thank you for taking the time for your Medicare Wellness Visit. I appreciate your continued commitment to your health goals. Please review the care plan we discussed, and feel free to reach out if I can assist you further.  Medicare recommends these wellness visits once per year to help you and your care team stay ahead of potential health issues. These visits are designed to focus on prevention, allowing your provider to concentrate on managing your acute and chronic conditions during your regular appointments.  Please note that Annual Wellness Visits do not include a physical exam. Some assessments may be limited, especially if the visit was conducted virtually. If needed, we may recommend a separate in-person follow-up with your provider.  Ongoing Care Seeing your primary care provider every 3 to 6 months helps us  monitor your health and provide consistent, personalized care.   Referrals If a referral was made during today's visit and you haven't received any updates within two weeks, please contact the referred provider directly to check on the status.  Recommended Screenings:  Health Maintenance  Topic Date Due   Zoster (Shingles) Vaccine (1 of 2) 01/05/2005   Breast Cancer Screening  04/20/2024   Medicare Annual Wellness Visit  11/20/2024   Colon Cancer Screening  05/28/2026   DTaP/Tdap/Td vaccine (2 - Td or Tdap) 03/30/2031   Pneumococcal Vaccine for age over 21  Completed   Flu Shot  Completed   DEXA scan (bone density measurement)  Completed   Hepatitis C Screening  Completed   HPV Vaccine  Aged Out   Meningitis B Vaccine  Aged Out   COVID-19 Vaccine  Discontinued       11/21/2023    8:54 AM  Advanced Directives  Does Patient Have a Medical Advance Directive? Yes  Type of Estate agent of [place];Living will  Copy of Healthcare Power of Attorney in Chart? No - copy requested   Advance Care Planning is important because it: Ensures  you receive medical care that aligns with your values, goals, and preferences. Provides guidance to your family and loved ones, reducing the emotional burden of decision-making during critical moments.  Vision: Annual vision screenings are recommended for early detection of glaucoma, cataracts, and diabetic retinopathy. These exams can also reveal signs of chronic conditions such as diabetes and high blood pressure.  Dental: Annual dental screenings help detect early signs of oral cancer, gum disease, and other conditions linked to overall health, including heart disease and diabetes.  Please see the attached documents for additional preventive care recommendations.

## 2023-11-26 ENCOUNTER — Other Ambulatory Visit: Payer: Self-pay | Admitting: Family Medicine

## 2023-11-26 DIAGNOSIS — E7800 Pure hypercholesterolemia, unspecified: Secondary | ICD-10-CM

## 2023-12-31 ENCOUNTER — Other Ambulatory Visit: Payer: Self-pay | Admitting: Family Medicine

## 2023-12-31 DIAGNOSIS — E559 Vitamin D deficiency, unspecified: Secondary | ICD-10-CM

## 2024-01-08 DIAGNOSIS — K080 Exfoliation of teeth due to systemic causes: Secondary | ICD-10-CM | POA: Diagnosis not present

## 2024-01-16 ENCOUNTER — Ambulatory Visit: Admitting: Family Medicine

## 2024-02-13 ENCOUNTER — Ambulatory Visit: Admitting: Family Medicine

## 2024-02-13 ENCOUNTER — Encounter: Payer: Self-pay | Admitting: Family Medicine

## 2024-02-13 VITALS — BP 128/78 | HR 68 | Temp 97.7°F | Resp 16 | Ht 64.0 in | Wt 167.4 lb

## 2024-02-13 DIAGNOSIS — E039 Hypothyroidism, unspecified: Secondary | ICD-10-CM

## 2024-02-13 DIAGNOSIS — E559 Vitamin D deficiency, unspecified: Secondary | ICD-10-CM | POA: Diagnosis not present

## 2024-02-13 DIAGNOSIS — E7800 Pure hypercholesterolemia, unspecified: Secondary | ICD-10-CM

## 2024-02-13 DIAGNOSIS — H8101 Meniere's disease, right ear: Secondary | ICD-10-CM

## 2024-02-13 DIAGNOSIS — K529 Noninfective gastroenteritis and colitis, unspecified: Secondary | ICD-10-CM | POA: Diagnosis not present

## 2024-02-13 DIAGNOSIS — K6289 Other specified diseases of anus and rectum: Secondary | ICD-10-CM | POA: Diagnosis not present

## 2024-02-13 LAB — CBC WITH DIFFERENTIAL/PLATELET
Basophils Absolute: 0 K/uL (ref 0.0–0.1)
Basophils Relative: 1 % (ref 0.0–3.0)
Eosinophils Absolute: 0.1 K/uL (ref 0.0–0.7)
Eosinophils Relative: 3.4 % (ref 0.0–5.0)
HCT: 41.5 % (ref 36.0–46.0)
Hemoglobin: 14.4 g/dL (ref 12.0–15.0)
Lymphocytes Relative: 35.3 % (ref 12.0–46.0)
Lymphs Abs: 1.5 K/uL (ref 0.7–4.0)
MCHC: 34.6 g/dL (ref 30.0–36.0)
MCV: 89.8 fl (ref 78.0–100.0)
Monocytes Absolute: 0.4 K/uL (ref 0.1–1.0)
Monocytes Relative: 10.4 % (ref 3.0–12.0)
Neutro Abs: 2.1 K/uL (ref 1.4–7.7)
Neutrophils Relative %: 49.9 % (ref 43.0–77.0)
Platelets: 218 K/uL (ref 150.0–400.0)
RBC: 4.62 Mil/uL (ref 3.87–5.11)
RDW: 13.1 % (ref 11.5–15.5)
WBC: 4.2 K/uL (ref 4.0–10.5)

## 2024-02-13 LAB — COMPREHENSIVE METABOLIC PANEL WITH GFR
ALT: 19 U/L (ref 0–35)
AST: 22 U/L (ref 0–37)
Albumin: 4.6 g/dL (ref 3.5–5.2)
Alkaline Phosphatase: 78 U/L (ref 39–117)
BUN: 13 mg/dL (ref 6–23)
CO2: 31 meq/L (ref 19–32)
Calcium: 9.6 mg/dL (ref 8.4–10.5)
Chloride: 100 meq/L (ref 96–112)
Creatinine, Ser: 0.86 mg/dL (ref 0.40–1.20)
GFR: 69.08 mL/min (ref >60.00)
Glucose, Bld: 103 mg/dL — ABNORMAL HIGH (ref 70–99)
Potassium: 4.3 meq/L (ref 3.5–5.1)
Sodium: 138 meq/L (ref 135–145)
Total Bilirubin: 0.9 mg/dL (ref 0.2–1.2)
Total Protein: 6.9 g/dL (ref 6.0–8.3)

## 2024-02-13 LAB — LIPID PANEL
Cholesterol: 160 mg/dL (ref 0–200)
HDL: 65.4 mg/dL (ref >39.00)
LDL Cholesterol: 82 mg/dL (ref 0–99)
NonHDL: 94.18
Total CHOL/HDL Ratio: 2
Triglycerides: 61 mg/dL (ref 0.0–149.0)
VLDL: 12.2 mg/dL (ref 0.0–40.0)

## 2024-02-13 LAB — VITAMIN D 25 HYDROXY (VIT D DEFICIENCY, FRACTURES): VITD: 39.58 ng/mL (ref 30.00–100.00)

## 2024-02-13 LAB — TSH: TSH: 0.67 u[IU]/mL (ref 0.35–5.50)

## 2024-02-13 NOTE — Progress Notes (Signed)
 Established Patient Office Visit   Subjective:  Patient ID: Brianna Luna, female    DOB: 09-Mar-1955  Age: 68 y.o. MRN: [id number]  Chief Complaint  Patient presents with   Quality Metric Gaps    Zoster vaccines   Medical Management of Chronic Issues    Patient presents today for a 7 month follow-up.    HPI Encounter Diagnoses  Name Primary?   Elevated LDL cholesterol level Yes   Meniere's disease of right ear    Vitamin D  deficiency    Acquired hypothyroidism    Anal irritation    For follow-up of above.  Continues atorvastatin  20 for LDL elevation.  Has been taking her vitamin D  regularly.  Continues 62.5 mcg of levothyroxine  daily for hypothyroidism.  She is exercising regularly by walking and going to yoga.  Continues with regular dental care.  Both she and her husband are retired.  She has 2 daughters.  1 daughter and her husband will be joining them for Christmas.  The second daughter is estranged and living in [place] .   Review of Systems  Constitutional: Negative.   HENT: Negative.    Eyes:  Negative for blurred vision, discharge and redness.  Respiratory: Negative.    Cardiovascular: Negative.   Gastrointestinal:  Negative for abdominal pain.  Genitourinary: Negative.   Musculoskeletal: Negative.  Negative for myalgias.  Skin:  Negative for rash.  Neurological:  Negative for tingling, loss of consciousness and weakness.  Endo/Heme/Allergies:  Negative for polydipsia.    Current Medications[1]   Objective:     BP 128/78   Pulse 68   Temp 97.7 F (36.5 C)   Resp 16   Ht 5' 4 (1.626 m)   Wt 167 lb 6.4 oz (75.9 kg)   SpO2 99%   BMI 28.73 kg/m    Physical Exam Constitutional:      General: She is not in acute distress.    Appearance: Normal appearance. She is not ill-appearing, toxic-appearing or diaphoretic.  HENT:     Head: Normocephalic and atraumatic.     Right Ear: External ear normal.     Left Ear: External ear normal.     Mouth/Throat:      Mouth: Mucous membranes are moist.     Pharynx: Oropharynx is clear. No oropharyngeal exudate or posterior oropharyngeal erythema.  Eyes:     General: No scleral icterus.       Right eye: No discharge.        Left eye: No discharge.     Extraocular Movements: Extraocular movements intact.     Conjunctiva/sclera: Conjunctivae normal.     Pupils: Pupils are equal, round, and reactive to light.  Cardiovascular:     Rate and Rhythm: Normal rate and regular rhythm.  Pulmonary:     Effort: Pulmonary effort is normal. No respiratory distress.     Breath sounds: Normal breath sounds. No wheezing or rales.  Musculoskeletal:     Cervical back: No rigidity or tenderness.  Lymphadenopathy:     Cervical: No cervical adenopathy.  Skin:    General: Skin is warm and dry.  Neurological:     Mental Status: She is alert and oriented to person, place, and time.  Psychiatric:        Mood and Affect: Mood normal.        Behavior: Behavior normal.      No results found for any visits on 02/13/24.    The 10-year ASCVD risk score (Arnett DK, et al.,  2019) is: 7.9%    Assessment & Plan:   Elevated LDL cholesterol level -     Comprehensive metabolic panel with GFR -     Lipid panel  Meniere's disease of right ear  Vitamin D  deficiency -     VITAMIN D  25 Hydroxy (Vit-D Deficiency, Fractures)  Acquired hypothyroidism -     CBC with Differential/Platelet -     TSH  Anal irritation -     Ambulatory referral to Gastroenterology    Return in about 6 months (around 08/13/2024), or Please have zoster vaccine done through your pharmacy, for chronic disease follow-up.    Elsie Sim Lent, MD    [1]  Current Outpatient Medications:    atorvastatin  (LIPITOR) 20 MG tablet, TAKE 1 TABLET BY MOUTH DAILY, Disp: 90 tablet, Rfl: 3   clobetasol  (TEMOVATE ) 0.05 % external solution, Apply 1 Application topically 2 (two) times daily., Disp: 50 mL, Rfl: 0   ketoconazole  2%-triamcinolone  0.1%  1:2 cream mixture, Apply topically 2 (two) times daily., Disp: 45 g, Rfl: 0   levothyroxine  (SYNTHROID ) 125 MCG tablet, Take 0.5 tablets (62.5 mcg total) by mouth daily. Each morning at least 30 minutes before eating., Disp: 45 tablet, Rfl: 3   triamterene -hydrochlorothiazide (DYAZIDE) 37.5-25 MG capsule, Take 1 each (1 capsule total) by mouth every morning., Disp: 90 capsule, Rfl: 1   valACYclovir  (VALTREX ) 1000 MG tablet, TAKE 1/2 TABLET BY MOUTH TWICE A DAY FOR 3 DAYS, Disp: 12 tablet, Rfl: 1   Vitamin D , Ergocalciferol , (DRISDOL ) 1.25 MG (50000 UNIT) CAPS capsule, TAKE 1 CAPSULE BY MOUTH ONCE A WEEK, Disp: 12 capsule, Rfl: 1

## 2024-02-16 ENCOUNTER — Ambulatory Visit: Payer: Self-pay | Admitting: Family Medicine

## 2024-03-01 ENCOUNTER — Encounter: Payer: Self-pay | Admitting: Family Medicine

## 2024-03-01 DIAGNOSIS — K6289 Other specified diseases of anus and rectum: Secondary | ICD-10-CM

## 2024-03-05 NOTE — Telephone Encounter (Signed)
 Pt states that  Dr. Candi said she doesn't need an colonoscopy she was good no referral needed.

## 2024-03-08 NOTE — Addendum Note (Signed)
 Addended by: BERNETA ELSIE LABOR on: 03/08/2024 08:08 AM   Modules accepted: Orders

## 2024-03-13 ENCOUNTER — Other Ambulatory Visit: Payer: Self-pay | Admitting: Family Medicine

## 2024-03-13 DIAGNOSIS — H8101 Meniere's disease, right ear: Secondary | ICD-10-CM

## 2024-03-13 DIAGNOSIS — H903 Sensorineural hearing loss, bilateral: Secondary | ICD-10-CM

## 2024-03-18 NOTE — Telephone Encounter (Signed)
 Pt stated she seen GI on 12/31 said no colonoscopy needed for 2 years and no issue found. She also stated still having issue that was discuss with PCP

## 2024-03-18 NOTE — Addendum Note (Signed)
 Addended by: BERNETA ELSIE LABOR on: 03/18/2024 12:39 PM   Modules accepted: Orders

## 2024-04-06 ENCOUNTER — Other Ambulatory Visit: Payer: Self-pay | Admitting: Family Medicine

## 2024-04-06 DIAGNOSIS — E039 Hypothyroidism, unspecified: Secondary | ICD-10-CM

## 2024-08-13 ENCOUNTER — Ambulatory Visit: Admitting: Family Medicine

## 2024-11-26 ENCOUNTER — Ambulatory Visit
